# Patient Record
Sex: Male | Born: 1965 | Race: White | Hispanic: No | Marital: Married | Smoking: Never smoker
Health system: Southern US, Community
[De-identification: ages and names within clinical notes are randomized; demographics above are authoritative.]

## PROBLEM LIST (undated history)

## (undated) DIAGNOSIS — Z9861 Coronary angioplasty status: Secondary | ICD-10-CM

## (undated) DIAGNOSIS — F32A Depression, unspecified: Secondary | ICD-10-CM

## (undated) DIAGNOSIS — M1990 Unspecified osteoarthritis, unspecified site: Secondary | ICD-10-CM

## (undated) DIAGNOSIS — G4730 Sleep apnea, unspecified: Secondary | ICD-10-CM

## (undated) DIAGNOSIS — G43909 Migraine, unspecified, not intractable, without status migrainosus: Secondary | ICD-10-CM

## (undated) DIAGNOSIS — F329 Major depressive disorder, single episode, unspecified: Secondary | ICD-10-CM

## (undated) DIAGNOSIS — I10 Essential (primary) hypertension: Secondary | ICD-10-CM

## (undated) DIAGNOSIS — R51 Headache: Secondary | ICD-10-CM

## (undated) DIAGNOSIS — S81819A Laceration without foreign body, unspecified lower leg, initial encounter: Secondary | ICD-10-CM

## (undated) DIAGNOSIS — F419 Anxiety disorder, unspecified: Secondary | ICD-10-CM

## (undated) DIAGNOSIS — I200 Unstable angina: Secondary | ICD-10-CM

## (undated) DIAGNOSIS — I219 Acute myocardial infarction, unspecified: Secondary | ICD-10-CM

## (undated) DIAGNOSIS — R519 Headache, unspecified: Secondary | ICD-10-CM

## (undated) DIAGNOSIS — I2510 Atherosclerotic heart disease of native coronary artery without angina pectoris: Secondary | ICD-10-CM

## (undated) DIAGNOSIS — K219 Gastro-esophageal reflux disease without esophagitis: Secondary | ICD-10-CM

## (undated) HISTORY — PX: WISDOM TOOTH EXTRACTION: SHX21

## (undated) HISTORY — DX: Migraine, unspecified, not intractable, without status migrainosus: G43.909

## (undated) HISTORY — DX: Laceration without foreign body, unspecified lower leg, initial encounter: S81.819A

## (undated) HISTORY — DX: Depression, unspecified: F32.A

## (undated) HISTORY — DX: Anxiety disorder, unspecified: F41.9

## (undated) HISTORY — DX: Major depressive disorder, single episode, unspecified: F32.9

---

## 1996-01-04 HISTORY — PX: SHOULDER SURGERY: SHX246

## 1997-08-03 ENCOUNTER — Emergency Department (HOSPITAL_COMMUNITY): Admission: EM | Admit: 1997-08-03 | Discharge: 1997-08-03 | Payer: Self-pay | Admitting: Emergency Medicine

## 1997-12-12 ENCOUNTER — Encounter (HOSPITAL_COMMUNITY): Admission: RE | Admit: 1997-12-12 | Discharge: 1998-03-12 | Payer: Self-pay | Admitting: Emergency Medicine

## 1998-06-08 ENCOUNTER — Encounter: Payer: Self-pay | Admitting: Family Medicine

## 1998-06-08 ENCOUNTER — Ambulatory Visit (HOSPITAL_COMMUNITY): Admission: RE | Admit: 1998-06-08 | Discharge: 1998-06-08 | Payer: Self-pay | Admitting: Family Medicine

## 1999-06-29 ENCOUNTER — Emergency Department (HOSPITAL_COMMUNITY): Admission: EM | Admit: 1999-06-29 | Discharge: 1999-06-29 | Payer: Self-pay | Admitting: Emergency Medicine

## 1999-08-04 ENCOUNTER — Emergency Department (HOSPITAL_COMMUNITY): Admission: EM | Admit: 1999-08-04 | Discharge: 1999-08-04 | Payer: Self-pay

## 1999-08-05 ENCOUNTER — Emergency Department (HOSPITAL_COMMUNITY): Admission: EM | Admit: 1999-08-05 | Discharge: 1999-08-05 | Payer: Self-pay | Admitting: Internal Medicine

## 2000-07-04 ENCOUNTER — Emergency Department (HOSPITAL_COMMUNITY): Admission: EM | Admit: 2000-07-04 | Discharge: 2000-07-04 | Payer: Self-pay | Admitting: Emergency Medicine

## 2000-08-17 ENCOUNTER — Emergency Department (HOSPITAL_COMMUNITY): Admission: EM | Admit: 2000-08-17 | Discharge: 2000-08-17 | Payer: Self-pay | Admitting: *Deleted

## 2002-04-25 ENCOUNTER — Emergency Department (HOSPITAL_COMMUNITY): Admission: EM | Admit: 2002-04-25 | Discharge: 2002-04-25 | Payer: Self-pay | Admitting: Emergency Medicine

## 2002-06-20 ENCOUNTER — Emergency Department (HOSPITAL_COMMUNITY): Admission: EM | Admit: 2002-06-20 | Discharge: 2002-06-20 | Payer: Self-pay | Admitting: Emergency Medicine

## 2002-07-03 ENCOUNTER — Emergency Department (HOSPITAL_COMMUNITY): Admission: EM | Admit: 2002-07-03 | Discharge: 2002-07-03 | Payer: Self-pay | Admitting: Emergency Medicine

## 2004-10-05 ENCOUNTER — Ambulatory Visit: Payer: Self-pay | Admitting: Family Medicine

## 2004-11-16 ENCOUNTER — Ambulatory Visit: Payer: Self-pay | Admitting: Family Medicine

## 2005-02-15 ENCOUNTER — Ambulatory Visit: Payer: Self-pay | Admitting: Family Medicine

## 2005-02-17 ENCOUNTER — Ambulatory Visit: Payer: Self-pay | Admitting: Family Medicine

## 2005-09-13 ENCOUNTER — Ambulatory Visit: Payer: Self-pay | Admitting: Family Medicine

## 2005-09-20 ENCOUNTER — Ambulatory Visit: Payer: Self-pay | Admitting: Family Medicine

## 2005-11-02 ENCOUNTER — Ambulatory Visit: Payer: Self-pay | Admitting: Family Medicine

## 2006-01-06 ENCOUNTER — Ambulatory Visit: Payer: Self-pay | Admitting: Family Medicine

## 2006-01-17 ENCOUNTER — Ambulatory Visit: Payer: Self-pay | Admitting: Family Medicine

## 2006-01-17 LAB — CONVERTED CEMR LAB
ALT: 41 units/L — ABNORMAL HIGH (ref 0–40)
AST: 26 units/L (ref 0–37)
Cholesterol: 187 mg/dL (ref 0–200)
HDL: 34.1 mg/dL — ABNORMAL LOW (ref >39.0)
LDL Cholesterol: 113 mg/dL — ABNORMAL HIGH (ref 0–99)
Total CHOL/HDL Ratio: 5.5
Triglycerides: 197 mg/dL — ABNORMAL HIGH (ref 0–149)
VLDL: 39 mg/dL (ref 0–40)

## 2006-07-20 DIAGNOSIS — I10 Essential (primary) hypertension: Secondary | ICD-10-CM | POA: Insufficient documentation

## 2006-07-24 ENCOUNTER — Ambulatory Visit: Payer: Self-pay | Admitting: Family Medicine

## 2006-07-24 DIAGNOSIS — G4733 Obstructive sleep apnea (adult) (pediatric): Secondary | ICD-10-CM | POA: Insufficient documentation

## 2006-08-15 ENCOUNTER — Ambulatory Visit: Payer: Self-pay | Admitting: Pulmonary Disease

## 2006-08-15 ENCOUNTER — Encounter: Payer: Self-pay | Admitting: Family Medicine

## 2006-08-30 ENCOUNTER — Ambulatory Visit (HOSPITAL_BASED_OUTPATIENT_CLINIC_OR_DEPARTMENT_OTHER): Admission: RE | Admit: 2006-08-30 | Discharge: 2006-08-30 | Payer: Self-pay | Admitting: Pulmonary Disease

## 2006-09-13 ENCOUNTER — Ambulatory Visit: Payer: Self-pay | Admitting: Pulmonary Disease

## 2006-10-13 ENCOUNTER — Ambulatory Visit: Payer: Self-pay | Admitting: Pulmonary Disease

## 2006-11-15 ENCOUNTER — Encounter (INDEPENDENT_AMBULATORY_CARE_PROVIDER_SITE_OTHER): Payer: Self-pay | Admitting: *Deleted

## 2007-02-15 ENCOUNTER — Ambulatory Visit: Payer: Self-pay | Admitting: Family Medicine

## 2007-03-22 ENCOUNTER — Encounter (INDEPENDENT_AMBULATORY_CARE_PROVIDER_SITE_OTHER): Payer: Self-pay | Admitting: *Deleted

## 2007-03-22 ENCOUNTER — Ambulatory Visit: Payer: Self-pay | Admitting: Family Medicine

## 2007-05-01 ENCOUNTER — Ambulatory Visit: Payer: Self-pay | Admitting: Family Medicine

## 2007-05-01 LAB — CONVERTED CEMR LAB
ALT: 49 units/L (ref 0–53)
AST: 29 units/L (ref 0–37)
Albumin: 4.2 g/dL (ref 3.5–5.2)
Alkaline Phosphatase: 53 units/L (ref 39–117)
BUN: 14 mg/dL (ref 6–23)
Basophils Absolute: 0 10*3/uL (ref 0.0–0.1)
Basophils Relative: 0.8 % (ref 0.0–1.0)
Bilirubin, Direct: 0.1 mg/dL (ref 0.0–0.3)
CO2: 28 meq/L (ref 19–32)
Calcium: 9.2 mg/dL (ref 8.4–10.5)
Chloride: 104 meq/L (ref 96–112)
Cholesterol: 239 mg/dL (ref 0–200)
Creatinine, Ser: 1.1 mg/dL (ref 0.4–1.5)
Direct LDL: 166.1 mg/dL
Eosinophils Absolute: 0.1 10*3/uL (ref 0.0–0.7)
Eosinophils Relative: 2.3 % (ref 0.0–5.0)
GFR calc Af Amer: 95 mL/min
GFR calc non Af Amer: 78 mL/min
Glucose, Bld: 103 mg/dL — ABNORMAL HIGH (ref 70–99)
HCT: 41.7 % (ref 39.0–52.0)
HDL: 28.8 mg/dL — ABNORMAL LOW (ref >39.0)
Hemoglobin: 14.4 g/dL (ref 13.0–17.0)
Lymphocytes Relative: 34.1 % (ref 12.0–46.0)
MCHC: 34.5 g/dL (ref 30.0–36.0)
MCV: 85.9 fL (ref 78.0–100.0)
Monocytes Absolute: 0.3 10*3/uL (ref 0.1–1.0)
Monocytes Relative: 6.6 % (ref 3.0–12.0)
Neutro Abs: 2.9 10*3/uL (ref 1.4–7.7)
Neutrophils Relative %: 56.2 % (ref 43.0–77.0)
PSA: 0.33 ng/mL (ref 0.10–4.00)
Platelets: 202 10*3/uL (ref 150–400)
Potassium: 4.1 meq/L (ref 3.5–5.1)
RBC: 4.86 M/uL (ref 4.22–5.81)
RDW: 12.4 % (ref 11.5–14.6)
Sodium: 140 meq/L (ref 135–145)
TSH: 0.89 microintl units/mL (ref 0.35–5.50)
Total Bilirubin: 0.8 mg/dL (ref 0.3–1.2)
Total CHOL/HDL Ratio: 8.3
Total Protein: 7 g/dL (ref 6.0–8.3)
Triglycerides: 203 mg/dL (ref 0–149)
VLDL: 41 mg/dL — ABNORMAL HIGH (ref 0–40)
WBC: 5 10*3/uL (ref 4.5–10.5)

## 2007-05-04 ENCOUNTER — Ambulatory Visit: Payer: Self-pay | Admitting: Family Medicine

## 2007-05-04 DIAGNOSIS — R7309 Other abnormal glucose: Secondary | ICD-10-CM | POA: Insufficient documentation

## 2007-08-21 ENCOUNTER — Encounter (INDEPENDENT_AMBULATORY_CARE_PROVIDER_SITE_OTHER): Payer: Self-pay | Admitting: *Deleted

## 2007-10-27 ENCOUNTER — Encounter: Payer: Self-pay | Admitting: Pulmonary Disease

## 2007-11-06 ENCOUNTER — Ambulatory Visit: Payer: Self-pay | Admitting: Family Medicine

## 2007-11-07 ENCOUNTER — Encounter: Payer: Self-pay | Admitting: Family Medicine

## 2008-02-21 ENCOUNTER — Telehealth: Payer: Self-pay | Admitting: Family Medicine

## 2008-05-14 ENCOUNTER — Encounter (INDEPENDENT_AMBULATORY_CARE_PROVIDER_SITE_OTHER): Payer: Self-pay | Admitting: *Deleted

## 2008-05-23 ENCOUNTER — Ambulatory Visit: Payer: Self-pay | Admitting: Family Medicine

## 2008-05-24 LAB — CONVERTED CEMR LAB
ALT: 39 units/L (ref 0–53)
AST: 25 units/L (ref 0–37)
Albumin: 4.3 g/dL (ref 3.5–5.2)
Alkaline Phosphatase: 62 units/L (ref 39–117)
BUN: 12 mg/dL (ref 6–23)
Basophils Absolute: 0 10*3/uL (ref 0.0–0.1)
Basophils Relative: 0.2 % (ref 0.0–3.0)
Bilirubin, Direct: 0.1 mg/dL (ref 0.0–0.3)
CO2: 31 meq/L (ref 19–32)
Calcium: 9.4 mg/dL (ref 8.4–10.5)
Chloride: 105 meq/L (ref 96–112)
Cholesterol: 239 mg/dL — ABNORMAL HIGH (ref 0–200)
Creatinine, Ser: 1 mg/dL (ref 0.4–1.5)
Creatinine,U: 27.7 mg/dL
Direct LDL: 168 mg/dL
Eosinophils Absolute: 0.1 10*3/uL (ref 0.0–0.7)
Eosinophils Relative: 2.3 % (ref 0.0–5.0)
GFR calc non Af Amer: 86.77 mL/min (ref >60)
Glucose, Bld: 89 mg/dL (ref 70–99)
HCT: 42.8 % (ref 39.0–52.0)
HDL: 31.2 mg/dL — ABNORMAL LOW (ref >39.00)
Hemoglobin: 14.9 g/dL (ref 13.0–17.0)
Lymphocytes Relative: 27.5 % (ref 12.0–46.0)
Lymphs Abs: 1.6 10*3/uL (ref 0.7–4.0)
MCHC: 34.7 g/dL (ref 30.0–36.0)
MCV: 86.2 fL (ref 78.0–100.0)
Microalb Creat Ratio: 3.6 mg/g (ref 0.0–30.0)
Microalb, Ur: 0.1 mg/dL (ref 0.0–1.9)
Monocytes Absolute: 0.3 10*3/uL (ref 0.1–1.0)
Monocytes Relative: 5.8 % (ref 3.0–12.0)
Neutro Abs: 3.8 10*3/uL (ref 1.4–7.7)
Neutrophils Relative %: 64.2 % (ref 43.0–77.0)
PSA: 0.21 ng/mL (ref 0.10–4.00)
Platelets: 198 10*3/uL (ref 150.0–400.0)
Potassium: 3.9 meq/L (ref 3.5–5.1)
RBC: 4.97 M/uL (ref 4.22–5.81)
RDW: 12.6 % (ref 11.5–14.6)
Sodium: 139 meq/L (ref 135–145)
TSH: 0.75 microintl units/mL (ref 0.35–5.50)
Total Bilirubin: 1.1 mg/dL (ref 0.3–1.2)
Total CHOL/HDL Ratio: 8
Total Protein: 7.3 g/dL (ref 6.0–8.3)
Triglycerides: 221 mg/dL — ABNORMAL HIGH (ref 0.0–149.0)
VLDL: 44.2 mg/dL — ABNORMAL HIGH (ref 0.0–40.0)
WBC: 5.8 10*3/uL (ref 4.5–10.5)

## 2008-05-26 ENCOUNTER — Ambulatory Visit: Payer: Self-pay | Admitting: Family Medicine

## 2008-11-26 ENCOUNTER — Ambulatory Visit: Payer: Self-pay | Admitting: Family Medicine

## 2009-04-06 ENCOUNTER — Ambulatory Visit: Payer: Self-pay | Admitting: General Practice

## 2009-08-11 ENCOUNTER — Encounter (INDEPENDENT_AMBULATORY_CARE_PROVIDER_SITE_OTHER): Payer: Self-pay | Admitting: *Deleted

## 2010-02-02 NOTE — Letter (Signed)
Summary: Nadara Eaton letter  [place] at [facility]  [address] [place], [place] [address]   Phone: [phone]  Fax: [phone]       08/11/2009 MRN: [id number]  Martin Guzman [address] [place], [place]  [address]  Dear Mr. Sherr[facility] [place], and [facility] announce the retirement of Arta Silence, M.D., from full-time practice at the [facility] office effective July 02, 2009 and his plans of returning part-time.  It is important to Dr. Hetty Ely and to our practice that you understand that [facility] has seven physicians in our office for your health care needs.  We will continue to offer the same exceptional care that you have today.    Dr. Hetty Ely has spoken to many of you about his plans for retirement and returning part-time in the fall.   We will continue to work with you through the transition to schedule appointments for you in the office and meet the high standards that [facility] is committed to.   Again, it is with great pleasure that we share the news that Dr. Hetty Ely will return to [facility] at [facility] in October of 2011 with a reduced schedule.    If you have any questions, or would like to request an appointment with one of our physicians, please call us at [phone] and press the option for Scheduling an appointment.  We take pleasure in providing you with excellent patient care and look forward to seeing you at your next office visit.  Our [facility] Physicians are:  Martin Guzman, M.D. Martin Guzman, M.D. Martin Guzman, M.D. Martin Guzman, M.D. Martin Guzman, M.D. Martin Guzman, M.D. We proudly welcomed Martin Guzman, M.D. and Martin Guzman, M.D. to the practice in July/August 2011.  Sincerely,  [facility]

## 2010-05-18 NOTE — Assessment & Plan Note (Signed)
[facility]                             PULMONARY OFFICE NOTE   NAME:Lampley, AKHIL PISCOPO                      MRN:          [id number]  DATE:10/13/2006                            DOB:          August 28, 1965    SUBJECTIVE:  Mr. Thrush comes in today for followup after his recent  sleep study.  He was found to have severe obstructive sleep apnea with  an apnea/hypopnea index of 74 events per hour and O2 desaturation as low  as 63%.  I had a long discussion with the patient and have gone over his  study with him and answered all questions.   PHYSICAL EXAM:  GENERAL:  He is an overweight male in no acute distress.  Blood pressure is 120/76, pulse 87, temperature 98.2, weight 269 pounds,  desaturation on room air is 96%.   IMPRESSION:  Severe obstructive sleep apnea with significant O2  desaturation.  At this point in time, the patient really needs to work  aggressively on weight loss, and I would also highly recommend CPAP  therapy.  The patient is agreeable to this approach.  I have also  discussed with him other alternatives including upper airway surgery or  oral appliance, but I have explained to him these rarely result in cure  of this degree of sleep apnea.  The patient is agreeable to trying CPAP.   PLAN:  1. Initiate CPAP at 10 cm.  2. Work on weight loss.  3. Follow up in 4 weeks or sooner if there are problems.  We will try      to optimize his pressure at that time.     Barbaraann Share, MD,FCCP  Electronically Signed    [facility]  DD: 10/13/2006  DT: 10/13/2006  Job #: [id number]   cc:   Arta Silence, MD

## 2010-05-18 NOTE — Assessment & Plan Note (Signed)
HEALTHCARE                             PULMONARY OFFICE NOTE   NAME:Martin Guzman                      MRN:          [id number]  DATE:08/15/2006                            DOB:          Aug 14, 1965    HISTORY OF PRESENT ILLNESS:  The patient is a 45 year old gentleman who  I have been asked to see for possible obstructive sleep apnea.  The  patient has been noted to have loud snoring as well as pauses in his  breathing during sleep.  He has also had occasional choking episodes.  The patient typically goes to bed between 10 and 12 and gets up at 6  a.m. to start his day.  He is tired in the morning when he arises.  The  patient works as a Retail banker and does note significant sleep  pressure with periods of inactivity.  He also has difficulty with dozing  during TV and movies and also driving long distances.  The patient will  take Vivarin on occasions.  He has no difficulty with short distances.  Of note, his weight has increased about 78 pounds over the last two  years.   PAST MEDICAL HISTORY:  1. Hypertension.  2. Dyslipidemia.  3. History of chronic headaches.  4. History of allergic rhinitis.   CURRENT MEDICATIONS:  1. Hydrochlorothiazide 25 mg daily.  2. Lexapro 20 mg daily.  3. Pravastatin 20 mg daily.   ALLERGIES:  No known drug allergies.   SOCIAL HISTORY:  He is married and has children.  He has never smoked.   FAMILY HISTORY:  Remarkable for a brother who has had a heart attack,  otherwise noncontributory.   REVIEW OF SYSTEMS:  As per history of present illness.  Also see patient  intake form documented in the chart.   PHYSICAL EXAMINATION:  GENERAL:  He is an obese male in no acute  distress.  VITAL SIGNS:  Blood pressure 138/92, pulse 79, temperature 98.1, weight  is 278 pounds.  O2 saturation on room air is 96%.  HEENT:  Pupils equal, round, and reactive to light and accommodation.  Extraocular muscles are intact.   Nares are patent without discharge, but  does show deviation to the right.  Oropharynx does show severe  elongation of the soft palate with a normal uvula.  NECK:  Supple without JVD or lymphadenopathy.  No palpable thyromegaly.  CHEST:  Totally clear.  HEART:  Regular rate and rhythm with no murmurs, rubs, or gallops.  ABDOMEN:  Soft and nontender with good bowel sounds.  GENITOURINARY:  RECTAL:  BREASTS:  Not done and not indicated.  EXTREMITIES:  Lower extremities are without edema.  Pulses are intact  distally.  NEUROLOGY:  Alert and oriented with no obvious motor deficits.   IMPRESSION:  Probable obstructive sleep apnea.  The patient gives a very  good history for this.  She has abnormal upper airway anatomy, and is  morbidly obese.  I had a long discussion with him about sleep apnea and  the importance with respect to quality of life issues and cardiovascular  issues.  The patient is agreeable to proceeding with a sleep study.   PLAN:  1. Schedule for nocturnal polysomnogram.  2. Work on weight loss.  3. The patient will follow up after the above.     Barbaraann Share, MD,FCCP  Electronically Signed    [facility]  DD: 10/10/2006  DT: 10/11/2006  Job #: [id number]   cc:   Arta Silence, MD

## 2010-05-18 NOTE — Procedures (Signed)
NAME:  Martin Guzman, Martin Guzman NO.:  [id number]   MEDICAL RECORD NO.:  [id number]          PATIENT TYPE:  OUT   LOCATION:  SLEEP CENTER                 [facility]ACILITY[facility]  [facility]   PHYSICIAN:  Barbaraann Share, MD,FCCPDATE OF BIRTH:  Jun 05, 1965   DATE OF STUDY:  08/30/2006                            NOCTURNAL POLYSOMNOGRAM   REFERRING PHYSICIAN:  Barbaraann Share, MD,FCCP   LOCATION:  [facility].   INDICATIONS FOR THE STUDY:  Hypersomnia with sleep apnea.   EPWORTH SCORE:  Seven.   SLEEP ARCHITECTURE:  The patient had total sleep time of 425 minutes  with very little slow wave sleep and decreased REM.  Sleep onset latency  was normal and REM onset was very prolonged at 236 minutes.  Sleep  efficiency was decreased at 89%.   RESPIRATORY DATA:  The patient was found to have 203 hypopneas and 318  obstructive apneas for an apnea-hypopnea index of 74 events per hour.  The events occurred in all body positions and there was very loud  snoring noted throughout.   OXYGEN DATA:  There was O2 saturation as low as 63% with patient's  obstructive events.   CARDIAC DATA:  No clinically significant cardiac arrhythmias were noted.   MOVEMENTS/PARASOMNIA:  None.   IMPRESSION/RECOMMENDATIONS:  Severe obstructive sleep apnea/hypopnea  syndrome with apnea-hypopnea index of 74 events per hour and oxygen  desaturation as low as 63%.  Treatment for this degree of sleep apnea  should focus primarily on continuous positive airway pressure as well as  weight loss.      Barbaraann Share, MD,FCCP  Diplomate, American Board of Sleep  Medicine  Electronically Signed     [facility]  D:  09/13/2006 14:02:22  T:  09/14/2006 10:07:52  Job:  [id number]

## 2012-10-29 DIAGNOSIS — M129 Arthropathy, unspecified: Secondary | ICD-10-CM | POA: Insufficient documentation

## 2012-10-29 DIAGNOSIS — R12 Heartburn: Secondary | ICD-10-CM | POA: Insufficient documentation

## 2012-10-29 DIAGNOSIS — K219 Gastro-esophageal reflux disease without esophagitis: Secondary | ICD-10-CM | POA: Insufficient documentation

## 2012-10-29 DIAGNOSIS — M159 Polyosteoarthritis, unspecified: Secondary | ICD-10-CM | POA: Insufficient documentation

## 2012-10-29 DIAGNOSIS — G43909 Migraine, unspecified, not intractable, without status migrainosus: Secondary | ICD-10-CM | POA: Insufficient documentation

## 2012-12-11 DIAGNOSIS — R030 Elevated blood-pressure reading, without diagnosis of hypertension: Secondary | ICD-10-CM | POA: Insufficient documentation

## 2012-12-11 DIAGNOSIS — E782 Mixed hyperlipidemia: Secondary | ICD-10-CM | POA: Insufficient documentation

## 2012-12-11 DIAGNOSIS — E785 Hyperlipidemia, unspecified: Secondary | ICD-10-CM | POA: Insufficient documentation

## 2012-12-11 DIAGNOSIS — E291 Testicular hypofunction: Secondary | ICD-10-CM | POA: Insufficient documentation

## 2013-06-20 DIAGNOSIS — M549 Dorsalgia, unspecified: Secondary | ICD-10-CM | POA: Insufficient documentation

## 2013-12-03 DIAGNOSIS — R609 Edema, unspecified: Secondary | ICD-10-CM | POA: Insufficient documentation

## 2014-04-22 DIAGNOSIS — F330 Major depressive disorder, recurrent, mild: Secondary | ICD-10-CM | POA: Insufficient documentation

## 2014-04-22 DIAGNOSIS — F411 Generalized anxiety disorder: Secondary | ICD-10-CM | POA: Insufficient documentation

## 2015-05-04 DIAGNOSIS — F330 Major depressive disorder, recurrent, mild: Secondary | ICD-10-CM | POA: Diagnosis not present

## 2015-05-04 DIAGNOSIS — I119 Hypertensive heart disease without heart failure: Secondary | ICD-10-CM | POA: Diagnosis not present

## 2015-06-04 DIAGNOSIS — Z23 Encounter for immunization: Secondary | ICD-10-CM | POA: Diagnosis not present

## 2015-06-04 DIAGNOSIS — I119 Hypertensive heart disease without heart failure: Secondary | ICD-10-CM | POA: Diagnosis not present

## 2015-06-05 DIAGNOSIS — H5213 Myopia, bilateral: Secondary | ICD-10-CM | POA: Diagnosis not present

## 2015-07-14 DIAGNOSIS — I119 Hypertensive heart disease without heart failure: Secondary | ICD-10-CM | POA: Diagnosis not present

## 2015-09-14 DIAGNOSIS — I119 Hypertensive heart disease without heart failure: Secondary | ICD-10-CM | POA: Diagnosis not present

## 2015-09-14 DIAGNOSIS — Z23 Encounter for immunization: Secondary | ICD-10-CM | POA: Diagnosis not present

## 2015-11-16 DIAGNOSIS — I119 Hypertensive heart disease without heart failure: Secondary | ICD-10-CM | POA: Diagnosis not present

## 2015-11-30 ENCOUNTER — Other Ambulatory Visit (HOSPITAL_COMMUNITY): Payer: Self-pay | Admitting: Internal Medicine

## 2015-11-30 DIAGNOSIS — R079 Chest pain, unspecified: Secondary | ICD-10-CM

## 2015-11-30 DIAGNOSIS — R0789 Other chest pain: Secondary | ICD-10-CM | POA: Diagnosis not present

## 2015-12-01 ENCOUNTER — Ambulatory Visit (HOSPITAL_COMMUNITY)
Admission: RE | Admit: 2015-12-01 | Discharge: 2015-12-01 | Disposition: A | Discharge status: Home / Self Care | Payer: Self-pay | Source: Ambulatory Visit | Attending: Internal Medicine | Admitting: Internal Medicine

## 2015-12-01 DIAGNOSIS — R079 Chest pain, unspecified: Secondary | ICD-10-CM

## 2015-12-01 DIAGNOSIS — R0789 Other chest pain: Secondary | ICD-10-CM | POA: Diagnosis not present

## 2015-12-07 ENCOUNTER — Other Ambulatory Visit: Payer: Self-pay

## 2015-12-07 ENCOUNTER — Telehealth: Payer: Self-pay | Admitting: Cardiovascular Disease

## 2015-12-07 NOTE — Telephone Encounter (Signed)
Received records from Dr Levin Erp for appointment on 12/08/15 with Dr Gwenlyn Found.  Records given to [facility] (medical records) for Dr Kennon Holter schedule on 12/08/15. lp

## 2015-12-08 ENCOUNTER — Ambulatory Visit (INDEPENDENT_AMBULATORY_CARE_PROVIDER_SITE_OTHER): Payer: BLUE CROSS/BLUE SHIELD | Admitting: Cardiovascular Disease

## 2015-12-08 ENCOUNTER — Other Ambulatory Visit: Payer: Self-pay | Admitting: Cardiovascular Disease

## 2015-12-08 ENCOUNTER — Ambulatory Visit
Admission: RE | Admit: 2015-12-08 | Discharge: 2015-12-08 | Disposition: A | Discharge status: Home / Self Care | Payer: BLUE CROSS/BLUE SHIELD | Source: Ambulatory Visit | Attending: Cardiovascular Disease | Admitting: Cardiovascular Disease

## 2015-12-08 ENCOUNTER — Encounter: Payer: Self-pay | Admitting: Cardiovascular Disease

## 2015-12-08 VITALS — BP 96/70 | HR 62 | Ht 72.0 in | Wt 308.0 lb

## 2015-12-08 DIAGNOSIS — I10 Essential (primary) hypertension: Secondary | ICD-10-CM | POA: Diagnosis not present

## 2015-12-08 DIAGNOSIS — R079 Chest pain, unspecified: Secondary | ICD-10-CM

## 2015-12-08 DIAGNOSIS — Z01810 Encounter for preprocedural cardiovascular examination: Secondary | ICD-10-CM

## 2015-12-08 DIAGNOSIS — R0789 Other chest pain: Secondary | ICD-10-CM | POA: Diagnosis not present

## 2015-12-08 DIAGNOSIS — I208 Other forms of angina pectoris: Secondary | ICD-10-CM

## 2015-12-08 LAB — CBC WITH DIFFERENTIAL/PLATELET
BASOS PCT: 0 %
Basophils Absolute: 0 cells/uL (ref 0–200)
Eosinophils Absolute: 132 cells/uL (ref 15–500)
Eosinophils Relative: 2 %
HCT: 49.8 % (ref 38.5–50.0)
Hemoglobin: 17.1 g/dL (ref 13.2–17.1)
LYMPHS PCT: 38 %
Lymphs Abs: 2508 cells/uL (ref 850–3900)
MCH: 30 pg (ref 27.0–33.0)
MCHC: 34.3 g/dL (ref 32.0–36.0)
MCV: 87.4 fL (ref 80.0–100.0)
MONOS PCT: 6 %
MPV: 11.2 fL (ref 7.5–12.5)
Monocytes Absolute: 396 cells/uL (ref 200–950)
Neutro Abs: 3564 cells/uL (ref 1500–7800)
Neutrophils Relative %: 54 %
PLATELETS: 203 10*3/uL (ref 140–400)
RBC: 5.7 MIL/uL (ref 4.20–5.80)
RDW: 14.3 % (ref 11.0–15.0)
WBC: 6.6 10*3/uL (ref 3.8–10.8)

## 2015-12-08 LAB — BASIC METABOLIC PANEL WITH GFR
BUN: 13 mg/dL (ref 7–25)
CALCIUM: 9.3 mg/dL (ref 8.6–10.3)
CHLORIDE: 102 mmol/L (ref 98–110)
CO2: 23 mmol/L (ref 20–31)
CREATININE: 1.27 mg/dL (ref 0.70–1.33)
GFR, EST NON AFRICAN AMERICAN: 65 mL/min (ref >=60)
GFR, Est African American: 76 mL/min (ref >=60)
Glucose, Bld: 126 mg/dL — ABNORMAL HIGH (ref 65–99)
Potassium: 4.1 mmol/L (ref 3.5–5.3)
SODIUM: 138 mmol/L (ref 135–146)

## 2015-12-08 LAB — TSH: TSH: 1 m[IU]/L (ref 0.40–4.50)

## 2015-12-08 NOTE — Patient Instructions (Signed)
Your physician has requested that you have a cardiac catheterization. Cardiac catheterization is used to diagnose and/or treat various heart conditions. Doctors may recommend this procedure for a number of different reasons. The most common reason is to evaluate chest pain. Chest pain can be a symptom of coronary artery disease (CAD), and cardiac catheterization can show whether plaque is narrowing or blocking your heart's arteries. This procedure is also used to evaluate the valves, as well as measure the blood flow and oxygen levels in different parts of your heart. For further information please visit [email or web address].   Following your catheterization, you will not be allowed to drive for 3 days.  No lifting, pushing, or pulling greater that 10 pounds is allowed for 1 week.  You will be required to have the following tests prior to the procedure:  1. Blood work-the blood work can be done no more than 14 days prior to the procedure.  It can be done at any [facility] lab.  There is one downstairs on the first floor of this building and one in the [facility] building [phone]).  2. Chest Xray-the chest xray order has already been placed at the [place].      Puncture site RADIAL

## 2015-12-08 NOTE — Assessment & Plan Note (Signed)
History of hypertension with blood pressure measured today at 96/70. He is on hydrochlorothiazide, lisinopril and metoprolol. Continue current meds at current dosing

## 2015-12-08 NOTE — Progress Notes (Signed)
12/08/2015 Roemello Langbehn Sperling   Dec 26, 1965  [id number]  Primary Physician GREEN, Keenan Bachelor, MD Primary Cardiologist: [name] MD [name]  HPI:  Mr. Cotterill is a 50 year old mildly overweight married Caucasian male father of one 61 year old daughter accompanied by his wife Cindi today. He was referred by [name] as well as his PCP, [name] for cardiovascular evaluation because of new-onset chest pain. His risk factors include treated hypertension and hyperlipidemia untreated according to the chart. He does have obstructive sleep apnea on sleep. He does not smoke nor is he diabetic. He has a strong family history for heart disease with a mother who has apparently 76 stents by his account and a brother who had stenting at age 40. New-onset chest pain approximately 4 weeks ago has had 6 episodes since. Currently during times of stress and exercise with left upper extremity radiation. He did have a routine GXT at the hospital by [name] on 11/2015 which was nonischemic although he developed substernal chest pain relieved with sublingual nitroglycerin.   Current Outpatient Prescriptions  Medication Sig Dispense Refill  . ALPRAZolam (NIRAVAM) 0.25 MG dissolvable tablet Take 0.25 mg by mouth as needed.    . ARIPiprazole (ABILIFY) 2 MG tablet Take 2 mg by mouth daily.    Marland Kitchen aspirin 81 MG tablet Take 81 mg by mouth daily.    . Fluvoxamine Maleate 150 MG CP24 Take 150 mg by mouth daily.    . hydrochlorothiazide (HYDRODIURIL) 25 MG tablet Take 25 mg by mouth daily.    Marland Kitchen lisinopril (PRINIVIL,ZESTRIL) 40 MG tablet Take 40 mg by mouth daily.    . metoprolol succinate (TOPROL-XL) 50 MG 24 hr tablet Take 50 mg by mouth daily. Take with or immediately following a meal.    . testosterone cypionate (DEPOTESTOSTERONE CYPIONATE) 200 MG/ML injection Inject 200 mg into the muscle every 14 (fourteen) days.     No current facility-administered medications for this visit.     No  Known Allergies  Social History   Social History  . Marital status: Married    Spouse name: N/A  . Number of children: N/A  . Years of education: N/A   Occupational History  . Not on file.   Social History Main Topics  . Smoking status: Never Smoker  . Smokeless tobacco: Never Used  . Alcohol use Not on file  . Drug use: Unknown  . Sexual activity: Not on file   Other Topics Concern  . Not on file   Social History Narrative  . No narrative on file     Review of Systems: General: negative for chills, fever, night sweats or weight changes.  Cardiovascular: negative for chest pain, dyspnea on exertion, edema, orthopnea, palpitations, paroxysmal nocturnal dyspnea or shortness of breath Dermatological: negative for rash Respiratory: negative for cough or wheezing Urologic: negative for hematuria Abdominal: negative for nausea, vomiting, diarrhea, bright red blood per rectum, melena, or hematemesis Neurologic: negative for visual changes, syncope, or dizziness All other systems reviewed and are otherwise negative except as noted above.    Blood pressure 96/70, pulse 62, height 6' (1.829 m), weight (!) 308 lb (139.7 kg).  General appearance: alert and no distress Neck: no adenopathy, no carotid bruit, no JVD, supple, symmetrical, trachea midline and thyroid not enlarged, symmetric, no tenderness/mass/nodules Lungs: clear to auscultation bilaterally Heart: regular rate and rhythm, S1, S2 normal, no murmur, click, rub or gallop Extremities: extremities normal, atraumatic, no cyanosis or edema  EKG sinus rhythm at 62 poor R-wave progression. I personally reviewed this EKG  ASSESSMENT AND PLAN:   Essential hypertension History of hypertension with blood pressure measured today at 96/70. He is on hydrochlorothiazide, lisinopril and metoprolol. Continue current meds at current dosing  Hyperlipidemia History of hyperlipidemia not on statin therapy followed by his PCP  Chest  pain Mr. Wulf was referred for evaluation of new onset chest pain. He is a 77-year-old mildly overweight Caucasian male with a strong family history of heart disease and history of hypertension. Currently his mother has had "72 stents" and a brother had a stent at age 41. He noticed new-onset chest pain approximately one month ago and has had problems with 6 episodes. These occur during times of stress and exertion with left upper extremity radiation. Recent routine GXT performed by [name] on 11/23/15 was negative for ischemia although the patient did have chest pain relieved with supplemental nitroglycerin. Based on his symptoms and risk factor profile I decided to proceed directly to the outpatient clinic catheterization via right radial approach. I have reviewed the risks, indications, and alternatives to cardiac catheterization, possible angioplasty, and stenting with the patient. Risks include but are not limited to bleeding, infection, vascular injury, stroke, myocardial infection, arrhythmia, kidney injury, radiation-related injury in the case of prolonged fluoroscopy use, emergency cardiac surgery, and death. The patient understands the risks of serious complication is 1-2 in 123XX123 with diagnostic cardiac cath and 1-2% or less with angioplasty/stenting.       [name] MD FACP,FACC,FAHA, [facility] 12/08/2015 10:35 AM

## 2015-12-08 NOTE — Assessment & Plan Note (Signed)
History of hyperlipidemia not on statin therapy followed by his PCP 

## 2015-12-08 NOTE — Assessment & Plan Note (Signed)
Martin Guzman was referred for evaluation of new onset chest pain. He is a 50-year-old mildly overweight Caucasian male with a strong family history of heart disease and history of hypertension. Currently his mother has had "77 stents" and a brother had a stent at age 92. He noticed new-onset chest pain approximately one month ago and has had problems with 6 episodes. These occur during times of stress and exertion with left upper extremity radiation. Recent routine GXT performed by [name] on 11/23/15 was negative for ischemia although the patient did have chest pain relieved with supplemental nitroglycerin. Based on his symptoms and risk factor profile I decided to proceed directly to the outpatient clinic catheterization via right radial approach. I have reviewed the risks, indications, and alternatives to cardiac catheterization, possible angioplasty, and stenting with the patient. Risks include but are not limited to bleeding, infection, vascular injury, stroke, myocardial infection, arrhythmia, kidney injury, radiation-related injury in the case of prolonged fluoroscopy use, emergency cardiac surgery, and death. The patient understands the risks of serious complication is 1-2 in 123XX123 with diagnostic cardiac cath and 1-2% or less with angioplasty/stenting.

## 2015-12-09 LAB — PROTIME-INR
INR: 1
Prothrombin Time: 10.4 s (ref 9.0–11.5)

## 2015-12-09 LAB — APTT: APTT: 27 s (ref 22–34)

## 2015-12-14 ENCOUNTER — Encounter (HOSPITAL_COMMUNITY): Payer: Self-pay | Admitting: Cardiovascular Disease

## 2015-12-14 ENCOUNTER — Ambulatory Visit (HOSPITAL_COMMUNITY)
Admission: RE | Admit: 2015-12-14 | Discharge: 2015-12-15 | Disposition: A | Discharge status: Home / Self Care | Payer: BLUE CROSS/BLUE SHIELD | Source: Ambulatory Visit | Attending: Cardiovascular Disease | Admitting: Cardiovascular Disease

## 2015-12-14 ENCOUNTER — Encounter (HOSPITAL_COMMUNITY)
Admission: RE | Disposition: A | Discharge status: Home / Self Care | Payer: Self-pay | Source: Ambulatory Visit | Attending: Cardiovascular Disease

## 2015-12-14 DIAGNOSIS — I200 Unstable angina: Secondary | ICD-10-CM

## 2015-12-14 DIAGNOSIS — I959 Hypotension, unspecified: Secondary | ICD-10-CM | POA: Diagnosis not present

## 2015-12-14 DIAGNOSIS — E782 Mixed hyperlipidemia: Secondary | ICD-10-CM | POA: Diagnosis present

## 2015-12-14 DIAGNOSIS — R079 Chest pain, unspecified: Secondary | ICD-10-CM

## 2015-12-14 DIAGNOSIS — I25118 Atherosclerotic heart disease of native coronary artery with other forms of angina pectoris: Secondary | ICD-10-CM

## 2015-12-14 DIAGNOSIS — I2510 Atherosclerotic heart disease of native coronary artery without angina pectoris: Secondary | ICD-10-CM

## 2015-12-14 DIAGNOSIS — I10 Essential (primary) hypertension: Secondary | ICD-10-CM | POA: Diagnosis present

## 2015-12-14 DIAGNOSIS — I25119 Atherosclerotic heart disease of native coronary artery with unspecified angina pectoris: Secondary | ICD-10-CM | POA: Diagnosis not present

## 2015-12-14 DIAGNOSIS — Z8249 Family history of ischemic heart disease and other diseases of the circulatory system: Secondary | ICD-10-CM | POA: Diagnosis not present

## 2015-12-14 DIAGNOSIS — E785 Hyperlipidemia, unspecified: Secondary | ICD-10-CM | POA: Diagnosis present

## 2015-12-14 DIAGNOSIS — I209 Angina pectoris, unspecified: Secondary | ICD-10-CM | POA: Insufficient documentation

## 2015-12-14 DIAGNOSIS — Z7982 Long term (current) use of aspirin: Secondary | ICD-10-CM | POA: Diagnosis not present

## 2015-12-14 DIAGNOSIS — Z9861 Coronary angioplasty status: Secondary | ICD-10-CM

## 2015-12-14 HISTORY — PX: CORONARY STENT PLACEMENT: SHX1402

## 2015-12-14 HISTORY — DX: Atherosclerotic heart disease of native coronary artery without angina pectoris: I25.10

## 2015-12-14 HISTORY — DX: Headache, unspecified: R51.9

## 2015-12-14 HISTORY — PX: CARDIAC CATHETERIZATION: SHX172

## 2015-12-14 HISTORY — DX: Headache: R51

## 2015-12-14 HISTORY — DX: Major depressive disorder, single episode, unspecified: F32.9

## 2015-12-14 HISTORY — DX: Unspecified osteoarthritis, unspecified site: M19.90

## 2015-12-14 HISTORY — DX: Gastro-esophageal reflux disease without esophagitis: K21.9

## 2015-12-14 HISTORY — DX: Unstable angina: I20.0

## 2015-12-14 HISTORY — DX: Coronary angioplasty status: Z98.61

## 2015-12-14 HISTORY — DX: Depression, unspecified: F32.A

## 2015-12-14 HISTORY — DX: Anxiety disorder, unspecified: F41.9

## 2015-12-14 HISTORY — DX: Essential (primary) hypertension: I10

## 2015-12-14 LAB — POCT ACTIVATED CLOTTING TIME: Activated Clotting Time: 538 seconds

## 2015-12-14 SURGERY — LEFT HEART CATH AND CORONARY ANGIOGRAPHY

## 2015-12-14 MED ORDER — ATORVASTATIN CALCIUM 80 MG PO TABS
80.0000 mg | ORAL_TABLET | Freq: Every day | ORAL | Status: DC
  Administered 2015-12-14: 21:00:00 80 mg via ORAL
  Filled 2015-12-14: qty 1

## 2015-12-14 MED ORDER — ASPIRIN 81 MG PO CHEW
CHEWABLE_TABLET | ORAL | Status: AC
  Filled 2015-12-14: qty 1

## 2015-12-14 MED ORDER — LISINOPRIL 40 MG PO TABS: 40.0000 mg | ORAL_TABLET | Freq: Every day | ORAL | Status: DC

## 2015-12-14 MED ORDER — HEPARIN SODIUM (PORCINE) 1000 UNIT/ML IJ SOLN
INTRAMUSCULAR | Status: AC
  Filled 2015-12-14: qty 1

## 2015-12-14 MED ORDER — BIVALIRUDIN BOLUS VIA INFUSION - CUPID
INTRAVENOUS | Status: DC | PRN
  Administered 2015-12-14: 104.775 mg via INTRAVENOUS

## 2015-12-14 MED ORDER — HYDRALAZINE HCL 20 MG/ML IJ SOLN: 5.0000 mg | INTRAMUSCULAR | Status: DC | PRN

## 2015-12-14 MED ORDER — SODIUM CHLORIDE 0.9 % IV SOLN
INTRAVENOUS | Status: DC | PRN
  Administered 2015-12-14 (×2): 1.75 mg/kg/h via INTRAVENOUS

## 2015-12-14 MED ORDER — LIDOCAINE HCL (PF) 1 % IJ SOLN
INTRAMUSCULAR | Status: DC | PRN
  Administered 2015-12-14: 4 mL

## 2015-12-14 MED ORDER — ZOLPIDEM TARTRATE 5 MG PO TABS
10.0000 mg | ORAL_TABLET | Freq: Every evening | ORAL | Status: DC | PRN
  Administered 2015-12-14: 10 mg via ORAL
  Filled 2015-12-14: qty 2

## 2015-12-14 MED ORDER — SODIUM CHLORIDE 0.9 % WEIGHT BASED INFUSION: 1.0000 mL/kg/h | INTRAVENOUS | Status: DC

## 2015-12-14 MED ORDER — SODIUM CHLORIDE 0.9% FLUSH: 3.0000 mL | INTRAVENOUS | Status: DC | PRN

## 2015-12-14 MED ORDER — SODIUM CHLORIDE 0.9 % IV SOLN: INTRAVENOUS | Status: DC

## 2015-12-14 MED ORDER — CLOPIDOGREL BISULFATE 300 MG PO TABS
ORAL_TABLET | ORAL | Status: AC
  Filled 2015-12-14: qty 2

## 2015-12-14 MED ORDER — SODIUM CHLORIDE 0.9 % WEIGHT BASED INFUSION
3.0000 mL/kg/h | INTRAVENOUS | Status: DC
  Administered 2015-12-14: 3 mL/kg/h via INTRAVENOUS

## 2015-12-14 MED ORDER — CLOPIDOGREL BISULFATE 300 MG PO TABS
ORAL_TABLET | ORAL | Status: DC | PRN
  Administered 2015-12-14: 600 mg via ORAL

## 2015-12-14 MED ORDER — HYDROCHLOROTHIAZIDE 25 MG PO TABS: 25.0000 mg | ORAL_TABLET | Freq: Every day | ORAL | Status: DC

## 2015-12-14 MED ORDER — BIVALIRUDIN 250 MG IV SOLR
INTRAVENOUS | Status: AC
  Filled 2015-12-14: qty 250

## 2015-12-14 MED ORDER — VERAPAMIL HCL 2.5 MG/ML IV SOLN
INTRAVENOUS | Status: AC
  Filled 2015-12-14: qty 2

## 2015-12-14 MED ORDER — IOPAMIDOL (ISOVUE-370) INJECTION 76%
INTRAVENOUS | Status: AC
  Filled 2015-12-14: qty 100

## 2015-12-14 MED ORDER — ASPIRIN 81 MG PO CHEW
81.0000 mg | CHEWABLE_TABLET | Freq: Every day | ORAL | Status: DC
  Administered 2015-12-15: 10:00:00 81 mg via ORAL
  Filled 2015-12-14: qty 1

## 2015-12-14 MED ORDER — FAMOTIDINE IN NACL 20-0.9 MG/50ML-% IV SOLN
INTRAVENOUS | Status: AC
  Filled 2015-12-14: qty 50

## 2015-12-14 MED ORDER — HEPARIN (PORCINE) IN NACL 2-0.9 UNIT/ML-% IJ SOLN
INTRAMUSCULAR | Status: DC | PRN
  Administered 2015-12-14: 1000 mL

## 2015-12-14 MED ORDER — ANGIOPLASTY BOOK
Freq: Once | Status: AC
  Administered 2015-12-14: 21:00:00
  Filled 2015-12-14: qty 1

## 2015-12-14 MED ORDER — METOPROLOL SUCCINATE ER 50 MG PO TB24: 50.0000 mg | ORAL_TABLET | Freq: Every day | ORAL | Status: DC

## 2015-12-14 MED ORDER — VERAPAMIL HCL 2.5 MG/ML IV SOLN
INTRA_ARTERIAL | Status: DC | PRN
  Administered 2015-12-14: 15 mL via INTRA_ARTERIAL

## 2015-12-14 MED ORDER — METOPROLOL SUCCINATE ER 50 MG PO TB24
50.0000 mg | ORAL_TABLET | Freq: Every day | ORAL | Status: DC
  Administered 2015-12-14: 21:00:00 50 mg via ORAL
  Filled 2015-12-14: qty 1

## 2015-12-14 MED ORDER — HEPARIN (PORCINE) IN NACL 2-0.9 UNIT/ML-% IJ SOLN
INTRAMUSCULAR | Status: AC
  Filled 2015-12-14: qty 1000

## 2015-12-14 MED ORDER — HEPARIN SODIUM (PORCINE) 1000 UNIT/ML IJ SOLN
INTRAMUSCULAR | Status: DC | PRN
  Administered 2015-12-14: 7000 [IU] via INTRAVENOUS

## 2015-12-14 MED ORDER — ATORVASTATIN CALCIUM 80 MG PO TABS: 80.0000 mg | ORAL_TABLET | Freq: Every day | ORAL | Status: DC

## 2015-12-14 MED ORDER — METHYLPREDNISOLONE SODIUM SUCC 125 MG IJ SOLR
INTRAMUSCULAR | Status: AC
Start: 2015-12-14 — End: 2015-12-14
  Filled 2015-12-14: qty 2

## 2015-12-14 MED ORDER — LIDOCAINE HCL (PF) 1 % IJ SOLN
INTRAMUSCULAR | Status: AC
Start: 2015-12-14 — End: 2015-12-14
  Filled 2015-12-14: qty 30

## 2015-12-14 MED ORDER — SODIUM CHLORIDE 0.9% FLUSH
3.0000 mL | Freq: Two times a day (BID) | INTRAVENOUS | Status: DC
  Administered 2015-12-14: 3 mL via INTRAVENOUS

## 2015-12-14 MED ORDER — ACETAMINOPHEN 325 MG PO TABS: 650.0000 mg | ORAL_TABLET | ORAL | Status: DC | PRN

## 2015-12-14 MED ORDER — METHYLPREDNISOLONE SODIUM SUCC 125 MG IJ SOLR
INTRAMUSCULAR | Status: DC | PRN
  Administered 2015-12-14: 125 mg via INTRAVENOUS

## 2015-12-14 MED ORDER — ONDANSETRON HCL 4 MG/2ML IJ SOLN: 4.0000 mg | Freq: Four times a day (QID) | INTRAMUSCULAR | Status: DC | PRN

## 2015-12-14 MED ORDER — LABETALOL HCL 5 MG/ML IV SOLN: 10.0000 mg | INTRAVENOUS | Status: DC | PRN

## 2015-12-14 MED ORDER — SODIUM CHLORIDE 0.9 % IV SOLN
1.7500 mg/kg/h | INTRAVENOUS | Status: DC
  Administered 2015-12-14 (×3): 1.75 mg/kg/h via INTRAVENOUS
  Filled 2015-12-14 (×3): qty 250

## 2015-12-14 MED ORDER — NITROGLYCERIN 1 MG/10 ML FOR IR/CATH LAB
INTRA_ARTERIAL | Status: AC
  Filled 2015-12-14: qty 10

## 2015-12-14 MED ORDER — DIPHENHYDRAMINE HCL 50 MG/ML IJ SOLN
INTRAMUSCULAR | Status: DC | PRN
  Administered 2015-12-14: 25 mg via INTRAVENOUS

## 2015-12-14 MED ORDER — MORPHINE SULFATE (PF) 2 MG/ML IV SOLN: 2.0000 mg | INTRAVENOUS | Status: DC | PRN

## 2015-12-14 MED ORDER — ALPRAZOLAM 0.25 MG PO TABS: 0.2500 mg | ORAL_TABLET | Freq: Every evening | ORAL | Status: DC | PRN

## 2015-12-14 MED ORDER — CLOPIDOGREL BISULFATE 75 MG PO TABS
75.0000 mg | ORAL_TABLET | Freq: Every day | ORAL | Status: DC
  Administered 2015-12-15: 10:00:00 75 mg via ORAL
  Filled 2015-12-14: qty 1

## 2015-12-14 MED ORDER — FAMOTIDINE IN NACL 20-0.9 MG/50ML-% IV SOLN
INTRAVENOUS | Status: DC | PRN
  Administered 2015-12-14: 20 mg via INTRAVENOUS

## 2015-12-14 MED ORDER — ASPIRIN EC 81 MG PO TBEC: 81.0000 mg | DELAYED_RELEASE_TABLET | Freq: Every day | ORAL | Status: DC

## 2015-12-14 MED ORDER — IOPAMIDOL (ISOVUE-370) INJECTION 76%
INTRAVENOUS | Status: DC | PRN
  Administered 2015-12-14: 170 mL via INTRA_ARTERIAL

## 2015-12-14 MED ORDER — LISINOPRIL 40 MG PO TABS
40.0000 mg | ORAL_TABLET | Freq: Every day | ORAL | Status: DC
  Administered 2015-12-14: 40 mg via ORAL
  Filled 2015-12-14: qty 1

## 2015-12-14 MED ORDER — SODIUM CHLORIDE 0.9 % IV SOLN: 250.0000 mL | INTRAVENOUS | Status: DC | PRN

## 2015-12-14 MED ORDER — DIPHENHYDRAMINE HCL 50 MG/ML IJ SOLN
INTRAMUSCULAR | Status: AC
  Filled 2015-12-14: qty 1

## 2015-12-14 MED ORDER — ASPIRIN 81 MG PO CHEW
81.0000 mg | CHEWABLE_TABLET | ORAL | Status: AC
  Administered 2015-12-14: 81 mg via ORAL

## 2015-12-14 MED ORDER — HYDRALAZINE HCL 20 MG/ML IJ SOLN: 10.0000 mg | INTRAMUSCULAR | Status: DC | PRN

## 2015-12-14 SURGICAL SUPPLY — 23 items
BALLN MOZEC 2.0X12 (BALLOONS) ×2
BALLN ~~LOC~~ EUPHORA RX 2.5X12 (BALLOONS) ×2
BALLN ~~LOC~~ TREK RX 3.25X12 (BALLOONS) ×2
BALLOON MOZEC 2.0X12 (BALLOONS) IMPLANT
BALLOON ~~LOC~~ EUPHORA RX 2.5X12 (BALLOONS) IMPLANT
BALLOON ~~LOC~~ TREK RX 3.25X12 (BALLOONS) IMPLANT
CATH EXPO 5F FL3.5 (CATHETERS) ×1 IMPLANT
CATH OPTITORQUE TIG 4.0 5F (CATHETERS) ×1 IMPLANT
CATH VISTA GUIDE 6FR XBLAD3.5 (CATHETERS) ×1 IMPLANT
DEVICE RAD COMP TR BAND LRG (VASCULAR PRODUCTS) ×1 IMPLANT
GLIDESHEATH SLEND A-KIT 6F 22G (SHEATH) ×1 IMPLANT
GUIDEWIRE INQWIRE 1.5J.035X260 (WIRE) IMPLANT
INQWIRE 1.5J .035X260CM (WIRE) ×2
KIT ENCORE 26 ADVANTAGE (KITS) ×1 IMPLANT
KIT HEART LEFT (KITS) ×2 IMPLANT
PACK CARDIAC CATHETERIZATION (CUSTOM PROCEDURE TRAY) ×2 IMPLANT
STENT RESOLUTE ONYX 2.25X15 (Permanent Stent) ×1 IMPLANT
STENT RESOLUTE ONYX 3.0X15 (Permanent Stent) ×1 IMPLANT
SYR MEDRAD MARK V 150ML (SYRINGE) ×2 IMPLANT
TRANSDUCER W/STOPCOCK (MISCELLANEOUS) ×2 IMPLANT
TUBING CIL FLEX 10 FLL-RA (TUBING) ×2 IMPLANT
WIRE ASAHI PROWATER 180CM (WIRE) ×1 IMPLANT
WIRE HI TORQ VERSACORE-J 145CM (WIRE) ×1 IMPLANT

## 2015-12-14 NOTE — Interval H&P Note (Signed)
Cath Lab Visit (complete for each Cath Lab visit)  Clinical Evaluation Leading to the Procedure:   ACS: No.  Non-ACS:    Anginal Classification: CCS II  Anti-ischemic medical therapy: No Therapy  Non-Invasive Test Results: No non-invasive testing performed  Prior CABG: No previous CABG      History and Physical Interval Note:  12/14/2015 10:03 AM  Martin Guzman  has presented today for surgery, with the diagnosis of cp - hp  The various methods of treatment have been discussed with the patient and family. After consideration of risks, benefits and other options for treatment, the patient has consented to  Procedure(s): Left Heart Cath and Coronary Angiography (N/A) as a surgical intervention .  The patient's history has been reviewed, patient examined, no change in status, stable for surgery.  I have reviewed the patient's chart and labs.  Questions were answered to the patient's satisfaction.     Quay Burow

## 2015-12-14 NOTE — Progress Notes (Signed)
Pt has home cpap and places self on/off.  Rt will monitor.

## 2015-12-14 NOTE — Progress Notes (Signed)
TR BAND REMOVAL  LOCATION:    right radial  DEFLATED PER PROTOCOL:    Yes.    TIME BAND OFF / DRESSING APPLIED:    2000   SITE UPON ARRIVAL:    Level 0  SITE AFTER BAND REMOVAL:    Level 0  CIRCULATION SENSATION AND MOVEMENT:    Within Normal Limits   Yes.    COMMENTS:   PT TOLERATED WELL POST RADIAL ACCESS TEACHING COMPLETED. TEACH BACK DONE. RE-VERBALIZES RADIAL SITE CARE FOR NEXT 24 HOURS CRITICAL, AND NEXT 5-7 DAYS.

## 2015-12-14 NOTE — H&P (View-Only) (Signed)
12/08/2015 Charlis Stuchell Bible   January 12, 1965  [id number]  Primary Physician GREEN, Keenan Bachelor, MD Primary Cardiologist: [name] MD [name]  HPI:  Mr. Metcalfe is a 50 year old mildly overweight married Caucasian male father of one 11 year old daughter accompanied by his wife Cindi today. He was referred by [name] as well as his PCP, [name] for cardiovascular evaluation because of new-onset chest pain. His risk factors include treated hypertension and hyperlipidemia untreated according to the chart. He does have obstructive sleep apnea on sleep. He does not smoke nor is he diabetic. He has a strong family history for heart disease with a mother who has apparently 33 stents by his account and a brother who had stenting at age 46. New-onset chest pain approximately 4 weeks ago has had 6 episodes since. Currently during times of stress and exercise with left upper extremity radiation. He did have a routine GXT at the hospital by [name] on 11/2015 which was nonischemic although he developed substernal chest pain relieved with sublingual nitroglycerin.   Current Outpatient Prescriptions  Medication Sig Dispense Refill  . ALPRAZolam (NIRAVAM) 0.25 MG dissolvable tablet Take 0.25 mg by mouth as needed.    . ARIPiprazole (ABILIFY) 2 MG tablet Take 2 mg by mouth daily.    Marland Kitchen aspirin 81 MG tablet Take 81 mg by mouth daily.    . Fluvoxamine Maleate 150 MG CP24 Take 150 mg by mouth daily.    . hydrochlorothiazide (HYDRODIURIL) 25 MG tablet Take 25 mg by mouth daily.    Marland Kitchen lisinopril (PRINIVIL,ZESTRIL) 40 MG tablet Take 40 mg by mouth daily.    . metoprolol succinate (TOPROL-XL) 50 MG 24 hr tablet Take 50 mg by mouth daily. Take with or immediately following a meal.    . testosterone cypionate (DEPOTESTOSTERONE CYPIONATE) 200 MG/ML injection Inject 200 mg into the muscle every 14 (fourteen) days.     No current facility-administered medications for this visit.     No  Known Allergies  Social History   Social History  . Marital status: Married    Spouse name: N/A  . Number of children: N/A  . Years of education: N/A   Occupational History  . Not on file.   Social History Main Topics  . Smoking status: Never Smoker  . Smokeless tobacco: Never Used  . Alcohol use Not on file  . Drug use: Unknown  . Sexual activity: Not on file   Other Topics Concern  . Not on file   Social History Narrative  . No narrative on file     Review of Systems: General: negative for chills, fever, night sweats or weight changes.  Cardiovascular: negative for chest pain, dyspnea on exertion, edema, orthopnea, palpitations, paroxysmal nocturnal dyspnea or shortness of breath Dermatological: negative for rash Respiratory: negative for cough or wheezing Urologic: negative for hematuria Abdominal: negative for nausea, vomiting, diarrhea, bright red blood per rectum, melena, or hematemesis Neurologic: negative for visual changes, syncope, or dizziness All other systems reviewed and are otherwise negative except as noted above.    Blood pressure 96/70, pulse 62, height 6' (1.829 m), weight (!) 308 lb (139.7 kg).  General appearance: alert and no distress Neck: no adenopathy, no carotid bruit, no JVD, supple, symmetrical, trachea midline and thyroid not enlarged, symmetric, no tenderness/mass/nodules Lungs: clear to auscultation bilaterally Heart: regular rate and rhythm, S1, S2 normal, no murmur, click, rub or gallop Extremities: extremities normal, atraumatic, no cyanosis or edema  EKG sinus rhythm at 62 poor R-wave progression. I personally reviewed this EKG  ASSESSMENT AND PLAN:   Essential hypertension History of hypertension with blood pressure measured today at 96/70. He is on hydrochlorothiazide, lisinopril and metoprolol. Continue current meds at current dosing  Hyperlipidemia History of hyperlipidemia not on statin therapy followed by his PCP  Chest  pain Mr. Defenbaugh was referred for evaluation of new onset chest pain. He is a 85-year-old mildly overweight Caucasian male with a strong family history of heart disease and history of hypertension. Currently his mother has had "66 stents" and a brother had a stent at age 42. He noticed new-onset chest pain approximately one month ago and has had problems with 6 episodes. These occur during times of stress and exertion with left upper extremity radiation. Recent routine GXT performed by [name] on 11/23/15 was negative for ischemia although the patient did have chest pain relieved with supplemental nitroglycerin. Based on his symptoms and risk factor profile I decided to proceed directly to the outpatient clinic catheterization via right radial approach. I have reviewed the risks, indications, and alternatives to cardiac catheterization, possible angioplasty, and stenting with the patient. Risks include but are not limited to bleeding, infection, vascular injury, stroke, myocardial infection, arrhythmia, kidney injury, radiation-related injury in the case of prolonged fluoroscopy use, emergency cardiac surgery, and death. The patient understands the risks of serious complication is 1-2 in 123XX123 with diagnostic cardiac cath and 1-2% or less with angioplasty/stenting.       [name] MD FACP,FACC,FAHA, [facility] 12/08/2015 10:35 AM

## 2015-12-15 ENCOUNTER — Encounter (HOSPITAL_COMMUNITY): Payer: Self-pay | Admitting: Cardiology

## 2015-12-15 DIAGNOSIS — I200 Unstable angina: Secondary | ICD-10-CM

## 2015-12-15 DIAGNOSIS — I10 Essential (primary) hypertension: Secondary | ICD-10-CM | POA: Diagnosis not present

## 2015-12-15 DIAGNOSIS — Z7982 Long term (current) use of aspirin: Secondary | ICD-10-CM | POA: Diagnosis not present

## 2015-12-15 DIAGNOSIS — E785 Hyperlipidemia, unspecified: Secondary | ICD-10-CM

## 2015-12-15 DIAGNOSIS — I2510 Atherosclerotic heart disease of native coronary artery without angina pectoris: Secondary | ICD-10-CM

## 2015-12-15 DIAGNOSIS — I25119 Atherosclerotic heart disease of native coronary artery with unspecified angina pectoris: Secondary | ICD-10-CM | POA: Diagnosis not present

## 2015-12-15 DIAGNOSIS — I959 Hypotension, unspecified: Secondary | ICD-10-CM | POA: Diagnosis not present

## 2015-12-15 DIAGNOSIS — Z8249 Family history of ischemic heart disease and other diseases of the circulatory system: Secondary | ICD-10-CM | POA: Diagnosis not present

## 2015-12-15 DIAGNOSIS — Z9861 Coronary angioplasty status: Secondary | ICD-10-CM

## 2015-12-15 LAB — BASIC METABOLIC PANEL
Anion gap: 11 (ref 5–15)
BUN: 20 mg/dL (ref 6–20)
CO2: 22 mmol/L (ref 22–32)
CREATININE: 1.41 mg/dL — AB (ref 0.61–1.24)
Calcium: 9.1 mg/dL (ref 8.9–10.3)
Chloride: 102 mmol/L (ref 101–111)
GFR calc non Af Amer: 57 mL/min — ABNORMAL LOW (ref >60)
Glucose, Bld: 145 mg/dL — ABNORMAL HIGH (ref 65–99)
Potassium: 4.2 mmol/L (ref 3.5–5.1)
SODIUM: 135 mmol/L (ref 135–145)

## 2015-12-15 LAB — CBC
HCT: 46.7 % (ref 39.0–52.0)
Hemoglobin: 16.4 g/dL (ref 13.0–17.0)
MCH: 30.5 pg (ref 26.0–34.0)
MCHC: 35.1 g/dL (ref 30.0–36.0)
MCV: 86.8 fL (ref 78.0–100.0)
PLATELETS: 170 10*3/uL (ref 150–400)
RBC: 5.38 MIL/uL (ref 4.22–5.81)
RDW: 13.7 % (ref 11.5–15.5)
WBC: 15.8 10*3/uL — ABNORMAL HIGH (ref 4.0–10.5)

## 2015-12-15 MED ORDER — ATORVASTATIN CALCIUM 80 MG PO TABS: 80.0000 mg | ORAL_TABLET | Freq: Every day | ORAL | 12 refills | Status: AC

## 2015-12-15 MED ORDER — CLOPIDOGREL BISULFATE 75 MG PO TABS: 75.0000 mg | ORAL_TABLET | Freq: Every day | ORAL | 12 refills | Status: AC

## 2015-12-15 NOTE — Progress Notes (Signed)
Work note given to patient at discharge.

## 2015-12-15 NOTE — Progress Notes (Signed)
CARDIAC REHAB PHASE I   Pt just walked with RN. No c/o, feels well. Ed completed with pt and wife. Voiced understanding and planning to get back to exercise. Understands importance of Plavix. Will send referral to [facility] however his work schedule might be problematic. [place], ACSM 12/15/2015 8:47 AM

## 2015-12-15 NOTE — Progress Notes (Signed)
Patient ambulated independently in hall with steady gait. Stated he felt alittle cloudy, "from the [place].", denies lightheaded feeling, dizziness, chest pain or shortness of breath. Blood pressure 94/43 at 0709 and 91/16 at 0753 prior to ambulating. Blood pressure 121/78 at 0815 after ambulating. Cardiac rehab into see patient for education.

## 2015-12-15 NOTE — Discharge Summary (Signed)
Discharge Summary    Patient ID: Martin Guzman,  MRN: [id number], DOB/AGE: Sep 20, 1965 50 y.o.  Admit date: 12/14/2015 Discharge date: 12/15/2015  Primary Care Provider: Criselda Peaches Primary Cardiologist: Dr. Gwenlyn Found  Discharge Diagnoses    Principal Problem:   Progressive angina [facility]) Active Problems:   Essential hypertension   Hyperlipidemia with target low density lipoprotein (LDL) cholesterol less than 70 mg/dL   Chest pain   CAD S/P PCI x 2 to LAD   Allergies Allergies  Allergen Reactions  . Bee Venom Other (See Comments)  . Shellfish Allergy Other (See Comments)    Lip numbing.     Diagnostic Studies/Procedures    Coronary Stent Intervention  Left Heart Cath and Coronary Angiography 12/14/15    Mid LAD lesion, 70 %stenosed.  Post intervention, there is a 0% residual stenosis.  A stent was successfully placed.  Dist LAD lesion, 99 %stenosed.  Post intervention, there is a 0% residual stenosis.  A stent was successfully placed.    _____________   History of Present Illness     Mr. Martin Guzman is a 50 year old male with a past medical history of HTN, HLD and OSA. He presented to Dr. Kennon Holter office by referral from his PCP for chest pain. He has a strong family history of heart disease. He reported 6 episodes of chest pain during exercise. He was referred for left heart cath.   Hospital Course     Left heart cath was preformed on 12/14/15, cath report above. He had a 70% mid LAD lesion and a 99% distal LAD lesion, both were treated with DES. The rest of his coronary arteries were free of disease.   He will be on DAPT with ASA and Plavix. Also will start him on a high intensity statin. He remain on his metoprolol, lisinopril. His HCTZ was stopped as he was slightly hypotensive during admission. Can restart at follow up if he is hypertensive.   Will need FLP and LFT's in 6 weeks. His right radial site was stable without hematoma.   He was chest pain  free while walking with cardiac rehab. He will follow up with Dr. Gwenlyn Found in the next 1-2 weeks.   He was seen today by Dr. Ellyn Hack and deemed suitable for discharge.    _____________  Discharge Vitals Blood pressure 121/78, pulse 84, temperature 97.2 F (36.2 C), temperature source Axillary, resp. rate (!) 22, height 6' (1.829 m), weight (!) 308 lb 10.3 oz (140 kg), SpO2 94 %.  Filed Weights   12/14/15 0848 12/15/15 0537  Weight: (!) 308 lb (139.7 kg) (!) 308 lb 10.3 oz (140 kg)    Labs & Radiologic Studies     CBC  Recent Labs  12/15/15 0225  WBC 15.8*  HGB 16.4  HCT 46.7  MCV 86.8  PLT 123XX123   Basic Metabolic Panel  Recent Labs  12/15/15 0225  NA 135  K 4.2  CL 102  CO2 22  GLUCOSE 145*  BUN 20  CREATININE 1.41*  CALCIUM 9.1    Dg Chest 2 View  Result Date: 12/08/2015 CLINICAL DATA:  Patient is pre procedure for heart catheterization. Hypertension and chest tightness. EXAM: CHEST  2 VIEW COMPARISON:  None. FINDINGS: The mediastinal contour is normal. The heart size is mildly enlarged. Both lungs are clear. The visualized skeletal structures are unremarkable. IMPRESSION: No active cardiopulmonary disease. Electronically Signed   By: Abelardo Diesel M.D.   On: 12/08/2015 12:23  Disposition   Pt is being discharged home today in good condition.  Follow-up Plans & Appointments    Follow-up Information    Quay Burow, MD Follow up on 12/22/2015.   Specialties:  Cardiology, Radiology Why:  at 8:00 am for hospital follow up  Contact information: [address] [place] [address] [phone]          Discharge Instructions    AMB Referral to Cardiac Rehabilitation - Phase II    Complete by:  As directed    Diagnosis:  Coronary Stents   Amb Referral to Cardiac Rehabilitation    Complete by:  As directed    Diagnosis:   PTCA Coronary Stents     Diet - low sodium heart healthy    Complete by:  As directed    Discharge  instructions    Complete by:  As directed    Radial Site Care Refer to this sheet in the next few weeks. These instructions provide you with information on caring for yourself after your procedure. Your caregiver may also give you more specific instructions. Your treatment has been planned according to current medical practices, but problems sometimes occur. Call your caregiver if you have any problems or questions after your procedure. HOME CARE INSTRUCTIONS You may shower the day after the procedure.Remove the bandage (dressing) and gently wash the site with plain soap and water.Gently pat the site dry.  Do not apply powder or lotion to the site.  Do not submerge the affected site in water for 3 to 5 days.  Inspect the site at least twice daily.  Do not flex or bend the affected arm for 24 hours.  No lifting over 5 pounds (2.3 kg) for 5 days after your procedure.  Do not drive home if you are discharged the same day of the procedure. Have someone else drive you.  You may drive 24 hours after the procedure unless otherwise instructed by your caregiver.  What to expect: Any bruising will usually fade within 1 to 2 weeks.  Blood that collects in the tissue (hematoma) may be painful to the touch. It should usually decrease in size and tenderness within 1 to 2 weeks.  SEEK IMMEDIATE MEDICAL CARE IF: You have unusual pain at the radial site.  You have redness, warmth, swelling, or pain at the radial site.  You have drainage (other than a small amount of blood on the dressing).  You have chills.  You have a fever or persistent symptoms for more than 72 hours.  You have a fever and your symptoms suddenly get worse.  Your arm becomes pale, cool, tingly, or numb.  You have heavy bleeding from the site. Hold pressure on the site.    NO HEAVY LIFTING OR SEXUAL ACTIVITY for 7 DAYS. NO DRIVING for 3-5 DAYS. NO SOAKING BATHS, HOT TUBS, POOLS, ETC., for 7 DAYS   Increase activity slowly    Complete  by:  As directed       Discharge Medications   Current Discharge Medication List    START taking these medications   Details  atorvastatin (LIPITOR) 80 MG tablet Take 1 tablet (80 mg total) by mouth daily at 10 pm. Qty: 30 tablet, Refills: 12    clopidogrel (PLAVIX) 75 MG tablet Take 1 tablet (75 mg total) by mouth daily with breakfast. Qty: 30 tablet, Refills: 12      CONTINUE these medications which have NOT CHANGED   Details  ALPRAZolam (NIRAVAM) 0.25 MG  dissolvable tablet Take 0.25 mg by mouth at bedtime as needed for anxiety.     ARIPiprazole (ABILIFY) 2 MG tablet Take 2 mg by mouth every evening.     aspirin 81 MG tablet Take 81 mg by mouth daily.    Cholecalciferol (VITAMIN D) 2000 units tablet Take 2,000 Units by mouth daily.    Fluvoxamine Maleate 150 MG CP24 Take 150 mg by mouth daily.    lisinopril (PRINIVIL,ZESTRIL) 40 MG tablet Take 40 mg by mouth daily.    metoprolol succinate (TOPROL-XL) 50 MG 24 hr tablet Take 50 mg by mouth daily. Take with or immediately following a meal.    testosterone cypionate (DEPOTESTOSTERONE CYPIONATE) 200 MG/ML injection Inject 200 mg into the muscle every 14 (fourteen) days.      STOP taking these medications     hydrochlorothiazide (HYDRODIURIL) 25 MG tablet          Aspirin prescribed at discharge?  Yes High Intensity Statin Prescribed? (Lipitor 40-80mg  or Crestor 20-40mg ): Yes Beta Blocker Prescribed? Yes For EF 40% or less, Was ACEI/ARB Prescribed? No: EF was not assessed ADP Receptor Inhibitor Prescribed? (i.e. Plavix etc.-Includes Medically Managed Patients): Yes For EF <40%, Aldosterone Inhibitor Prescribed? No: EF not assessed  Was EF assessed during THIS hospitalization? No:  Was Cardiac Rehab II ordered? (Included Medically managed Patients): Yes   Outstanding Labs/Studies   FLP and LFT's in 6 weeks.   Duration of Discharge Encounter   Greater than 30 minutes including physician time.  Signed, Arbutus Leas NP 12/15/2015, 10:01 AM

## 2015-12-22 ENCOUNTER — Encounter: Payer: Self-pay | Admitting: Cardiovascular Disease

## 2015-12-22 ENCOUNTER — Other Ambulatory Visit: Payer: Self-pay | Admitting: Cardiovascular Disease

## 2015-12-22 ENCOUNTER — Telehealth: Payer: Self-pay | Admitting: Cardiovascular Disease

## 2015-12-22 ENCOUNTER — Ambulatory Visit (INDEPENDENT_AMBULATORY_CARE_PROVIDER_SITE_OTHER): Payer: BLUE CROSS/BLUE SHIELD | Admitting: Cardiovascular Disease

## 2015-12-22 VITALS — BP 110/72 | HR 59 | Ht 72.0 in | Wt 310.2 lb

## 2015-12-22 DIAGNOSIS — Z9861 Coronary angioplasty status: Secondary | ICD-10-CM

## 2015-12-22 DIAGNOSIS — E785 Hyperlipidemia, unspecified: Secondary | ICD-10-CM | POA: Diagnosis not present

## 2015-12-22 DIAGNOSIS — I10 Essential (primary) hypertension: Secondary | ICD-10-CM

## 2015-12-22 DIAGNOSIS — I2510 Atherosclerotic heart disease of native coronary artery without angina pectoris: Secondary | ICD-10-CM

## 2015-12-22 NOTE — Telephone Encounter (Signed)
Called Dr. Rolly Salter office to obtain Lipid panels done within the last year. [name] spoke with me and stated that Mr. Martin Guzman was seeing another provider from 2005-May 2017. He did have records of Lipid panel done in March 2017. [name] stated HDL--29, LDL--128, Total Cholesterol--235, Triglycerides--390.  Thanked [name] and asked if they could also have that faxed over so we could scan it into pt chart. He stated he will have it sent over today.

## 2015-12-22 NOTE — Assessment & Plan Note (Signed)
History of CAD status post mid and distal LAD PCI and drug-eluting stenting by myself 12/14/2015 performed via the right radial approach. He feels clinically improved. He has more energy and no longer has anginal chest pain. He remains on dual antiplatelet therapy.

## 2015-12-22 NOTE — Patient Instructions (Signed)
Medication Instructions: Your physician recommends that you continue on your current medications as directed. Please refer to the Current Medication list given to you today.   Labwork: I will request lab work from Dr. Rolly Salter office.   Follow-Up: Your physician wants you to follow-up in: 6 months with Dr. Gwenlyn Found. You will receive a reminder letter in the mail two months in advance. If you don't receive a letter, please call our office to schedule the follow-up appointment.  If you need a refill on your cardiac medications before your next appointment, please call your pharmacy.

## 2015-12-22 NOTE — Telephone Encounter (Signed)
No problem! Repeat orders entered, will call pt to inform him and mail lab slips.

## 2015-12-22 NOTE — Assessment & Plan Note (Signed)
History of hyperlipidemia on high-dose statin therapy followed by his PCP. 

## 2015-12-22 NOTE — Progress Notes (Signed)
12/22/2015 Martin Guzman   [birth year]  [id number]  Primary Physician GREEN, Martin Bachelor, MD Primary Cardiologist: [name] MD [name]  HPI:  Mr. Martin Guzman is a 50 year old mildly overweight married Caucasian male father of one 49 year old daughter accompanied by his wife Martin Guzman. I last saw him in the office 12/08/15. He was referred by Martin Guzman as well as his PCP, Dr. Nyoka Guzman for cardiovascular evaluation because of new-onset chest pain. His risk factors include treated hypertension and hyperlipidemia untreated according to the chart. He does have obstructive sleep apnea on sleep. He does not smoke nor is he diabetic. He has a strong family history for heart disease with a mother who has apparently 67 stents by his account and a brother who had stenting at age 5. New-onset chest pain approximately 4 weeks ago has had 6 episodes since. Currently during times of stress and exercise with left upper extremity radiation. He did have a routine GXT at the hospital by Dr. Nyoka Guzman on 11/2015 which was nonischemic although he developed substernal chest pain relieved with sublingual nitroglycerin. I elected to proceed with outpatient radial diagnostic coronary angiography to define his anatomy on 12/14/15 revealing 70% mid and 99% distal LAD stenoses both of which were stented with drug-eluting stents. He feels currently improved. His wife Martin Guzman  says he has more energy and he no longer has anginal chest pain.    Current Outpatient Prescriptions  Medication Sig Dispense Refill  . ALPRAZolam (NIRAVAM) 0.25 MG dissolvable tablet Take 0.25 mg by mouth at bedtime as needed for anxiety.     . ARIPiprazole (ABILIFY) 2 MG tablet Take 2 mg by mouth every evening.     Marland Kitchen aspirin 81 MG tablet Take 81 mg by mouth daily.    Marland Kitchen atorvastatin (LIPITOR) 80 MG tablet Take 1 tablet (80 mg total) by mouth daily at 10 pm. 30 tablet 12  . Cholecalciferol (VITAMIN D) 2000 units tablet Take 2,000  Units by mouth daily.    . clopidogrel (PLAVIX) 75 MG tablet Take 1 tablet (75 mg total) by mouth daily with breakfast. 30 tablet 12  . Fluvoxamine Maleate 150 MG CP24 Take 150 mg by mouth daily.    Marland Kitchen lisinopril (PRINIVIL,ZESTRIL) 40 MG tablet Take 40 mg by mouth daily.    . metoprolol succinate (TOPROL-XL) 50 MG 24 hr tablet Take 50 mg by mouth daily. Take with or immediately following a meal.    . nitroGLYCERIN (NITROSTAT) 0.4 MG SL tablet Place 0.4 mg under the tongue every 5 (five) minutes as needed for chest pain.    Marland Kitchen testosterone cypionate (DEPOTESTOSTERONE CYPIONATE) 200 MG/ML injection Inject 200 mg into the muscle every 14 (fourteen) days.     No current facility-administered medications for this visit.     Allergies  Allergen Reactions  . Bee Venom Other (See Comments)  . Shellfish Allergy Other (See Comments)    Lip numbing.     Social History   Social History  . Marital status: Married    Spouse name: N/A  . Number of children: N/A  . Years of education: N/A   Occupational History  . Not on file.   Social History Main Topics  . Smoking status: Never Smoker  . Smokeless tobacco: Never Used  . Alcohol use Yes     Comment: OCC  . Drug use: No  . Sexual activity: Not on file   Other Topics Concern  . Not on file  Social History Narrative  . No narrative on file     Review of Systems: General: negative for chills, fever, night sweats or weight changes.  Cardiovascular: negative for chest pain, dyspnea on exertion, edema, orthopnea, palpitations, paroxysmal nocturnal dyspnea or shortness of breath Dermatological: negative for rash Respiratory: negative for cough or wheezing Urologic: negative for hematuria Abdominal: negative for nausea, vomiting, diarrhea, bright red blood per rectum, melena, or hematemesis Neurologic: negative for visual changes, syncope, or dizziness All other systems reviewed and are otherwise negative except as noted  above.    Blood pressure 110/72, pulse (!) 59, height 6' (1.829 m), weight (!) 310 lb 3.2 oz (140.7 kg).  General appearance: alert and no distress Neck: no adenopathy, no carotid bruit, no JVD, supple, symmetrical, trachea midline and thyroid not enlarged, symmetric, no tenderness/mass/nodules Lungs: clear to auscultation bilaterally Heart: regular rate and rhythm, S1, S2 normal, no murmur, click, rub or gallop ExtremitieHis right radial puncture site is well healed. He does have what appears to be a thrombosed superficial vein in his left forearm.extremities normal, atraumatic, no cyanosis or edema and His right radial puncture site is well healed. He does have what appears to be a thrombosed superficial vein in his left forearm.  EKG sinus bradycardia 59 without ST or T-wave changes. I personally reviewed this EKG  ASSESSMENT AND PLAN:   Essential hypertension History of hypertension with blood pressure measured 96/70. He is on lisinopril and metoprolol. Continue current meds are Cardizem  Hyperlipidemia with target low density lipoprotein (LDL) cholesterol less than 70 mg/dL History of hyperlipidemia on high-dose statin therapy followed by his PCP  CAD S/P PCI x 2 to LAD History of CAD status post mid and distal LAD PCI and drug-eluting stenting by myself 12/14/2015 performed via the right radial approach. He feels clinically improved. He has more energy and no longer has anginal chest pain. He remains on dual antiplatelet therapy.      [name] MD FACP,FACC,FAHA, [facility] 12/22/2015 8:21 AM

## 2015-12-22 NOTE — Telephone Encounter (Signed)
Thx for doing that. Let's repeat a lipid and liver level

## 2015-12-22 NOTE — Assessment & Plan Note (Signed)
History of hypertension with blood pressure measured 96/70. He is on lisinopril and metoprolol. Continue current meds are Cardizem

## 2015-12-30 DIAGNOSIS — I10 Essential (primary) hypertension: Secondary | ICD-10-CM | POA: Diagnosis not present

## 2015-12-30 DIAGNOSIS — I2510 Atherosclerotic heart disease of native coronary artery without angina pectoris: Secondary | ICD-10-CM | POA: Diagnosis not present

## 2016-01-13 DIAGNOSIS — I2510 Atherosclerotic heart disease of native coronary artery without angina pectoris: Secondary | ICD-10-CM | POA: Diagnosis not present

## 2016-01-13 DIAGNOSIS — I10 Essential (primary) hypertension: Secondary | ICD-10-CM | POA: Diagnosis not present

## 2016-01-20 ENCOUNTER — Telehealth (HOSPITAL_COMMUNITY): Payer: Self-pay | Admitting: *Deleted

## 2016-01-20 NOTE — Telephone Encounter (Signed)
Pt contacted for sign up to cardiac rehab.  Pt declines at this time. Pt is already exercising consistently. Cherre Huger, BSN

## 2016-02-22 ENCOUNTER — Encounter: Payer: Self-pay | Admitting: Emergency Medicine

## 2016-02-22 ENCOUNTER — Ambulatory Visit (INDEPENDENT_AMBULATORY_CARE_PROVIDER_SITE_OTHER): Payer: BLUE CROSS/BLUE SHIELD | Admitting: Emergency Medicine

## 2016-02-22 DIAGNOSIS — G4733 Obstructive sleep apnea (adult) (pediatric): Secondary | ICD-10-CM | POA: Diagnosis not present

## 2016-02-22 NOTE — Progress Notes (Signed)
Subjective:    Patient ID: Martin Guzman, male    DOB: 10-Mar-1965, 51 y.o.   MRN: [id number]  HPI 51 year old man with a history of coronary artery disease (Dr. Gwenlyn Found), anxiety, hypertension, arthritis. He has been followed in our office by Dr Gwenette Greet for OSA. Last seen here in 2009. He has been treated with CPAP. He reports that his compliance is great. Unfortunately his machine (51 yrs old) broke down last week. He has missed it significantly. Day time sleepiness better when using CPAP. Still has mild snoring during the night on CPAP.  Epworth score today 12.    Review of Systems  Constitutional: Positive for fatigue. Negative for fever and unexpected weight change.  HENT: Negative for congestion, dental problem, ear pain, nosebleeds, postnasal drip, rhinorrhea, sinus pressure, sneezing, sore throat and trouble swallowing.   Eyes: Negative for redness and itching.  Respiratory: Negative for cough, chest tightness, shortness of breath and wheezing.   Cardiovascular: Negative for palpitations and leg swelling.  Gastrointestinal: Negative for nausea and vomiting.  Genitourinary: Negative for dysuria.  Musculoskeletal: Negative for joint swelling.  Skin: Negative for rash.  Neurological: Negative for headaches.  Hematological: Does not bruise/bleed easily.  Psychiatric/Behavioral: Negative for dysphoric mood. The patient is not nervous/anxious.    Past Medical History:  Diagnosis Date  . Anxiety   . Anxiety and depression   . Arthritis   . CAD S/P PCI x 2 to LAD 12/14/2015   Mid LAD lesion, 70 %stenosed. --> ([facility] [place] 3.0 x15), Dist LAD lesion, 99 %stenosed --> ([place] 2.25x15)   . Coronary artery disease   . Depression   . GERD (gastroesophageal reflux disease)   . Headache   . Hypertension   . Progressive angina ([place]) 12/14/2015   Cardiac Cath with m & d LAD lesions - PCI x 2 DES stents     Family History  Problem Relation Age of Onset  . Heart failure Mother   .  Hypertension Mother      Social History   Social History  . Marital status: Married    Spouse name: N/A  . Number of children: N/A  . Years of education: N/A   Occupational History  . Not on file.   Social History Main Topics  . Smoking status: Never Smoker  . Smokeless tobacco: Never Used  . Alcohol use Yes     Comment: OCC  . Drug use: No  . Sexual activity: Not on file   Other Topics Concern  . Not on file   Social History Narrative  . No narrative on file  works as a Dealer.   Allergies  Allergen Reactions  . Bee Venom Other (See Comments)  . Shellfish Allergy Other (See Comments)    Lip numbing.      Outpatient Medications Prior to Visit  Medication Sig Dispense Refill  . ALPRAZolam (NIRAVAM) 0.25 MG dissolvable tablet Take 0.25 mg by mouth at bedtime as needed for anxiety.     . ARIPiprazole (ABILIFY) 2 MG tablet Take 2 mg by mouth every evening.     Marland Kitchen aspirin 81 MG tablet Take 81 mg by mouth daily.    Marland Kitchen atorvastatin (LIPITOR) 80 MG tablet Take 1 tablet (80 mg total) by mouth daily at 10 pm. 30 tablet 12  . Cholecalciferol (VITAMIN D) 2000 units tablet Take 2,000 Units by mouth daily.    . clopidogrel (PLAVIX) 75 MG tablet Take 1 tablet (75 mg total) by mouth daily with  breakfast. 30 tablet 12  . Fluvoxamine Maleate 150 MG CP24 Take 150 mg by mouth daily.    Marland Kitchen lisinopril (PRINIVIL,ZESTRIL) 40 MG tablet Take 40 mg by mouth daily.    . metoprolol succinate (TOPROL-XL) 50 MG 24 hr tablet Take 50 mg by mouth daily. Take with or immediately following a meal.    . nitroGLYCERIN (NITROSTAT) 0.4 MG SL tablet Place 0.4 mg under the tongue every 5 (five) minutes as needed for chest pain.    Marland Kitchen testosterone cypionate (DEPOTESTOSTERONE CYPIONATE) 200 MG/ML injection Inject 200 mg into the muscle every 14 (fourteen) days.     No facility-administered medications prior to visit.         Objective:   Physical Exam Vitals:   02/22/16 1330  BP: 130/84  Pulse: 76    SpO2: 97%  Weight: (!) 311 lb (141.1 kg)  Height: 6' (1.829 m)   Gen: Pleasant, overweight man, in no distress,  normal affect  ENT: No lesions,  mouth clear,  oropharynx clear, no postnasal drip  Neck: No JVD, no TMG, no carotid bruits  Lungs: No use of accessory muscles, clear without rales or rhonchi  Cardiovascular: RRR, heart sounds normal, no murmur or gallops, trace peripheral edema  Musculoskeletal: No deformities, no cyanosis or clubbing  Neuro: alert, non focal  Skin: Warm, no lesions or rash      Assessment & Plan:  OSA (obstructive sleep apnea) Patient with documented obstructive sleep apnea from 2008. He has not followed up regularly but he indicates that he's had great compliance with his mask with good clinical response. Unfortunately his device is now malfunctioning and he needs a new one. I will get this through [facility] after I find a copy of his original sleep study. Then see him in 2 months to document compliance.   Baltazar Apo, MD, PhD 02/22/2016, 1:55 PM [facility] [phone] or if no answer [phone]

## 2016-02-22 NOTE — Assessment & Plan Note (Signed)
Patient with documented obstructive sleep apnea from 2008. He has not followed up regularly but he indicates that he's had great compliance with his mask with good clinical response. Unfortunately his device is now malfunctioning and he needs a new one. I will get this through [facility] after I find a copy of his original sleep study. Then see him in 2 months to document compliance.

## 2016-02-22 NOTE — Progress Notes (Signed)
   Subjective:    Patient ID: Martin Guzman, male    DOB: April 27, 1965, 51 y.o.   MRN: [id number]  HPI    Review of Systems     Objective:   Physical Exam        Assessment & Plan:

## 2016-02-22 NOTE — Patient Instructions (Addendum)
We will work on getting you a new CPAP machine through [facility]. Nasal pillows mask with all equipment, heated humidity.   Please follow up in 2 months to review your status on the new machine.

## 2016-02-26 DIAGNOSIS — G4733 Obstructive sleep apnea (adult) (pediatric): Secondary | ICD-10-CM | POA: Diagnosis not present

## 2016-03-07 ENCOUNTER — Telehealth: Payer: Self-pay | Admitting: Emergency Medicine

## 2016-03-07 NOTE — Telephone Encounter (Signed)
CMN was received and signed by RB. CMN has been given back to [place] to fax back. Angie at [facility] is aware of this. Nothing further was needed.

## 2016-03-25 DIAGNOSIS — G4733 Obstructive sleep apnea (adult) (pediatric): Secondary | ICD-10-CM | POA: Diagnosis not present

## 2016-04-25 DIAGNOSIS — G4733 Obstructive sleep apnea (adult) (pediatric): Secondary | ICD-10-CM | POA: Diagnosis not present

## 2016-04-27 ENCOUNTER — Ambulatory Visit: Payer: BLUE CROSS/BLUE SHIELD | Admitting: Emergency Medicine

## 2016-05-09 DIAGNOSIS — Z0000 Encounter for general adult medical examination without abnormal findings: Secondary | ICD-10-CM | POA: Diagnosis not present

## 2016-05-09 DIAGNOSIS — I10 Essential (primary) hypertension: Secondary | ICD-10-CM | POA: Diagnosis not present

## 2016-05-09 DIAGNOSIS — Z6841 Body Mass Index (BMI) 40.0 and over, adult: Secondary | ICD-10-CM | POA: Diagnosis not present

## 2016-05-09 DIAGNOSIS — G4730 Sleep apnea, unspecified: Secondary | ICD-10-CM | POA: Diagnosis not present

## 2016-05-09 DIAGNOSIS — I2510 Atherosclerotic heart disease of native coronary artery without angina pectoris: Secondary | ICD-10-CM | POA: Diagnosis not present

## 2016-05-10 ENCOUNTER — Ambulatory Visit (INDEPENDENT_AMBULATORY_CARE_PROVIDER_SITE_OTHER): Payer: BLUE CROSS/BLUE SHIELD | Admitting: Emergency Medicine

## 2016-05-10 ENCOUNTER — Encounter: Payer: Self-pay | Admitting: Emergency Medicine

## 2016-05-10 DIAGNOSIS — G4733 Obstructive sleep apnea (adult) (pediatric): Secondary | ICD-10-CM | POA: Diagnosis not present

## 2016-05-10 NOTE — Patient Instructions (Signed)
Please continue to wear your CPAP every night [facility] should sent your replacement parts intermittently (hoses, filters, masks, reservoir).   Call us for any problems with your device, or for any problems with your breathing at night.  Follow with Dr Lamonte Sakai in 12 months or sooner if you have any problems

## 2016-05-10 NOTE — Assessment & Plan Note (Signed)
Please continue to wear your CPAP every night [facility] should sent your replacement parts intermittently (hoses, filters, masks, reservoir).   Call us for any problems with your device, or for any problems with your breathing at night.  Follow with Dr Lamonte Sakai in 12 months or sooner if you have any problems

## 2016-05-10 NOTE — Progress Notes (Signed)
Subjective:    Patient ID: Martin Guzman, male    DOB: September 12, 1965, 51 y.o.   MRN: [id number]  HPI 51 year old man with a history of coronary artery disease (Dr. Gwenlyn Found), anxiety, hypertension, arthritis. He has been followed in our office by Dr Gwenette Greet for OSA. Last seen here in 2009. He has been treated with CPAP. He reports that his compliance is great. Unfortunately his machine (51 yrs old) broke down last week. He has missed it significantly. Day time sleepiness better when using CPAP. Still has mild snoring during the night on CPAP.  Epworth score today 12.   ROV 05/10/16 -- This is a follow-up visit for obstructive sleep apnea. He reestablished care with [place] on 02/22/16. He used to be seen by Dr Gwenette Greet. He was able to get new machine, new equipment, from [facility]. He has great compliance as documented by his download information today. He's been using it for 100% of the nights, 97% of the nights for greater than 4 hours. His AHI is 1.7 on a pressure of 10 cm H2O. minimal leak. He feels significant clinical benefit, is less sleepy, does not nap, has more energy.   Review of Systems  Constitutional: Positive for fatigue. Negative for fever and unexpected weight change.  HENT: Negative for congestion, dental problem, ear pain, nosebleeds, postnasal drip, rhinorrhea, sinus pressure, sneezing, sore throat and trouble swallowing.   Eyes: Negative for redness and itching.  Respiratory: Negative for cough, chest tightness, shortness of breath and wheezing.   Cardiovascular: Negative for palpitations and leg swelling.  Gastrointestinal: Negative for nausea and vomiting.  Genitourinary: Negative for dysuria.  Musculoskeletal: Negative for joint swelling.  Skin: Negative for rash.  Neurological: Negative for headaches.  Hematological: Does not bruise/bleed easily.  Psychiatric/Behavioral: Negative for dysphoric mood. The patient is not nervous/anxious.    Past Medical History:  Diagnosis Date    . Anxiety   . Anxiety and depression   . Arthritis   . CAD S/P PCI x 2 to LAD 12/14/2015   Mid LAD lesion, 70 %stenosed. --> (Resolute Onyx 3.0 x15), Dist LAD lesion, 99 %stenosed --> (Resolute Onyx 2.25x15)   . Coronary artery disease   . Depression   . GERD (gastroesophageal reflux disease)   . Headache   . Hypertension   . Progressive angina ([place]) 12/14/2015   Cardiac Cath with m & d LAD lesions - PCI x 2 DES stents     Family History  Problem Relation Age of Onset  . Heart failure Mother   . Hypertension Mother      Social History   Social History  . Marital status: Married    Spouse name: N/A  . Number of children: N/A  . Years of education: N/A   Occupational History  . Not on file.   Social History Main Topics  . Smoking status: Never Smoker  . Smokeless tobacco: Never Used  . Alcohol use Yes     Comment: OCC  . Drug use: No  . Sexual activity: Not on file   Other Topics Concern  . Not on file   Social History Narrative  . No narrative on file  works as a Dealer.   Allergies  Allergen Reactions  . Bee Venom Other (See Comments)  . Shellfish Allergy Other (See Comments)    Lip numbing.      Outpatient Medications Prior to Visit  Medication Sig Dispense Refill  . ALPRAZolam (NIRAVAM) 0.25 MG dissolvable tablet Take 0.25 mg  by mouth at bedtime as needed for anxiety.     . ARIPiprazole (ABILIFY) 2 MG tablet Take 2 mg by mouth every evening.     Marland Kitchen aspirin 81 MG tablet Take 81 mg by mouth daily.    Marland Kitchen atorvastatin (LIPITOR) 80 MG tablet Take 1 tablet (80 mg total) by mouth daily at 10 pm. 30 tablet 12  . Cholecalciferol (VITAMIN D) 2000 units tablet Take 2,000 Units by mouth daily.    . clopidogrel (PLAVIX) 75 MG tablet Take 1 tablet (75 mg total) by mouth daily with breakfast. 30 tablet 12  . Fluvoxamine Maleate 150 MG CP24 Take 150 mg by mouth daily.    Marland Kitchen lisinopril (PRINIVIL,ZESTRIL) 40 MG tablet Take 40 mg by mouth daily.    . metoprolol  succinate (TOPROL-XL) 50 MG 24 hr tablet Take 50 mg by mouth daily. Take with or immediately following a meal.    . nitroGLYCERIN (NITROSTAT) 0.4 MG SL tablet Place 0.4 mg under the tongue every 5 (five) minutes as needed for chest pain.    Marland Kitchen testosterone cypionate (DEPOTESTOSTERONE CYPIONATE) 200 MG/ML injection Inject 200 mg into the muscle every 14 (fourteen) days.     No facility-administered medications prior to visit.         Objective:   Physical Exam Vitals:   05/10/16 1616  BP: 118/80  Pulse: 71  SpO2: 95%  Weight: (!) 302 lb (137 kg)  Height: 6' (1.829 m)   Gen: Pleasant, overweight man, in no distress,  normal affect  ENT: No lesions,  mouth clear,  oropharynx clear, no postnasal drip  Neck: No JVD, no TMG, no carotid bruits  Lungs: No use of accessory muscles, clear without rales or rhonchi  Cardiovascular: RRR, heart sounds normal, no murmur or gallops, trace peripheral edema  Musculoskeletal: No deformities, no cyanosis or clubbing  Neuro: alert, non focal  Skin: Warm, no lesions or rash      Assessment & Plan:  OSA (obstructive sleep apnea) Please continue to wear your CPAP every night [facility] should sent your replacement parts intermittently (hoses, filters, masks, reservoir).   Call us for any problems with your device, or for any problems with your breathing at night.  Follow with Dr Lamonte Sakai in 12 months or sooner if you have any problems  Baltazar Apo, MD, PhD 05/10/2016, 4:42 PM [facility] [phone] or if no answer [phone]

## 2016-05-25 DIAGNOSIS — G4733 Obstructive sleep apnea (adult) (pediatric): Secondary | ICD-10-CM | POA: Diagnosis not present

## 2016-06-14 ENCOUNTER — Encounter: Payer: Self-pay | Admitting: Internal Medicine

## 2016-06-21 ENCOUNTER — Encounter: Payer: Self-pay | Admitting: Cardiovascular Disease

## 2016-06-21 ENCOUNTER — Ambulatory Visit (INDEPENDENT_AMBULATORY_CARE_PROVIDER_SITE_OTHER): Payer: BLUE CROSS/BLUE SHIELD | Admitting: Cardiovascular Disease

## 2016-06-21 VITALS — BP 126/62 | HR 66 | Ht 72.0 in | Wt 313.0 lb

## 2016-06-21 DIAGNOSIS — E785 Hyperlipidemia, unspecified: Secondary | ICD-10-CM | POA: Diagnosis not present

## 2016-06-21 DIAGNOSIS — I2510 Atherosclerotic heart disease of native coronary artery without angina pectoris: Secondary | ICD-10-CM

## 2016-06-21 DIAGNOSIS — Z9861 Coronary angioplasty status: Secondary | ICD-10-CM | POA: Diagnosis not present

## 2016-06-21 DIAGNOSIS — I10 Essential (primary) hypertension: Secondary | ICD-10-CM | POA: Diagnosis not present

## 2016-06-21 DIAGNOSIS — G4733 Obstructive sleep apnea (adult) (pediatric): Secondary | ICD-10-CM | POA: Diagnosis not present

## 2016-06-21 NOTE — Progress Notes (Signed)
06/21/2016 Martin Guzman   [birth year]  [id number]  Primary Physician Levin Erp, MD Primary Cardiologist: [name] MD [name]  HPI:   Mr. Ransford is a 51 year old mildly overweight married Caucasian male father of one 34 year old daughter . I last saw him in the office 12/22/15. He was referred by [name] as well as his PCP, [name] for cardiovascular evaluation because of new-onset chest pain. His risk factors include treated hypertension and hyperlipidemia untreated according to the chart. He does have obstructive sleep apnea on sleep. He does not smoke nor is he diabetic. He has a strong family history for heart disease with a mother who has apparently 19 stents by his account and a brother who had stenting at age 56. New-onset chest pain approximately 4 weeks ago has had 6 episodes since. Currently during times of stress and exercise with left upper extremity radiation. He did have a routine GXT at the hospital by [name] on 11/2015 which was nonischemic although he developed substernal chest pain relieved with sublingual nitroglycerin. I elected to proceed with outpatient radial diagnostic coronary angiography to define his anatomy on 12/14/15 revealing 70% mid and 99% distal LAD stenoses both of which were stented with drug-eluting stents. He feels currently improved. His wife [name]  says he has more energy and he no longer has anginal chest pain. Since I saw him 6 months ago he's had no recurrent symptoms.   Current Outpatient Prescriptions  Medication Sig Dispense Refill  . ALPRAZolam (NIRAVAM) 0.25 MG dissolvable tablet Take 0.25 mg by mouth at bedtime as needed for anxiety.     . ARIPiprazole (ABILIFY) 2 MG tablet Take 2 mg by mouth every evening.     Marland Kitchen aspirin 81 MG tablet Take 81 mg by mouth daily.    Marland Kitchen atorvastatin (LIPITOR) 80 MG tablet Take 1 tablet (80 mg total) by mouth daily at 10 pm. 30 tablet 12  . Cholecalciferol (VITAMIN D) 2000  units tablet Take 2,000 Units by mouth daily.    . clopidogrel (PLAVIX) 75 MG tablet Take 1 tablet (75 mg total) by mouth daily with breakfast. 30 tablet 12  . Fluvoxamine Maleate 150 MG CP24 Take 150 mg by mouth daily.    Marland Kitchen lisinopril (PRINIVIL,ZESTRIL) 40 MG tablet Take 40 mg by mouth daily.    . metoprolol succinate (TOPROL-XL) 50 MG 24 hr tablet Take 50 mg by mouth daily. Take with or immediately following a meal.    . nitroGLYCERIN (NITROSTAT) 0.4 MG SL tablet Place 0.4 mg under the tongue every 5 (five) minutes as needed for chest pain.    Marland Kitchen testosterone cypionate (DEPOTESTOSTERONE CYPIONATE) 200 MG/ML injection Inject 200 mg into the muscle every 14 (fourteen) days.     No current facility-administered medications for this visit.     Allergies  Allergen Reactions  . Bee Venom Other (See Comments)  . Shellfish Allergy Other (See Comments)    Lip numbing.     Social History   Social History  . Marital status: Married    Spouse name: N/A  . Number of children: N/A  . Years of education: N/A   Occupational History  . Not on file.   Social History Main Topics  . Smoking status: Never Smoker  . Smokeless tobacco: Never Used  . Alcohol use Yes     Comment: OCC  . Drug use: No  . Sexual activity: Not on file   Other Topics Concern  .  Not on file   Social History Narrative  . No narrative on file     Review of Systems: General: negative for chills, fever, night sweats or weight changes.  Cardiovascular: negative for chest pain, dyspnea on exertion, edema, orthopnea, palpitations, paroxysmal nocturnal dyspnea or shortness of breath Dermatological: negative for rash Respiratory: negative for cough or wheezing Urologic: negative for hematuria Abdominal: negative for nausea, vomiting, diarrhea, bright red blood per rectum, melena, or hematemesis Neurologic: negative for visual changes, syncope, or dizziness All other systems reviewed and are otherwise negative except as  noted above.    Blood pressure 126/62, pulse 66, height 6' (1.829 m), weight (!) 313 lb (142 kg).  General appearance: alert and no distress Neck: no adenopathy, no carotid bruit, no JVD, supple, symmetrical, trachea midline and thyroid not enlarged, symmetric, no tenderness/mass/nodules Lungs: clear to auscultation bilaterally Heart: regular rate and rhythm, S1, S2 normal, no murmur, click, rub or gallop Extremities: extremities normal, atraumatic, no cyanosis or edema  EKG sinus rhythm at 66 without ST or T-wave changes. I personally reviewed this EKG  ASSESSMENT AND PLAN:   Essential hypertension History of hypertension with blood pressure measured 126/62. He is on lisinopril and metoprolol. Continue current meds at current dosing.  Hyperlipidemia with target low density lipoprotein (LDL) cholesterol less than 70 mg/dL History of hyperlipidemia on statin therapy followed by his PCP  OSA (obstructive sleep apnea) History of obstructive sleep apnea on CPAP which he benefits from  CAD S/P PCI x 2 to LAD History of CAD status post mid and distal LAD PCI and stenting by myself 12/14/15 with Medtronic Onyx drug-eluting stents. Since that time he's had no recurrent angina.      [name] MD FACP,FACC,FAHA, [facility] 06/21/2016 4:17 PM

## 2016-06-21 NOTE — Assessment & Plan Note (Signed)
History of CAD status post mid and distal LAD PCI and stenting by myself 12/14/15 with Medtronic Onyx drug-eluting stents. Since that time he's had no recurrent angina.

## 2016-06-21 NOTE — Assessment & Plan Note (Signed)
History of hypertension with blood pressure measured 126/62. He is on lisinopril and metoprolol. Continue current meds at current dosing.

## 2016-06-21 NOTE — Assessment & Plan Note (Signed)
History of obstructive sleep apnea on CPAP which he benefits from 

## 2016-06-21 NOTE — Assessment & Plan Note (Signed)
History of hyperlipidemia on statin therapy followed by his PCP 

## 2016-06-21 NOTE — Patient Instructions (Signed)

## 2016-06-25 DIAGNOSIS — G4733 Obstructive sleep apnea (adult) (pediatric): Secondary | ICD-10-CM | POA: Diagnosis not present

## 2016-07-25 DIAGNOSIS — G4733 Obstructive sleep apnea (adult) (pediatric): Secondary | ICD-10-CM | POA: Diagnosis not present

## 2016-08-09 ENCOUNTER — Telehealth: Payer: Self-pay | Admitting: *Deleted

## 2016-08-09 NOTE — Telephone Encounter (Signed)
Called patient and left message for him to call [phone].  Patient is on Plavix with no GI Hx and needs o.v.  Pamala Hurry PV

## 2016-08-09 NOTE — Telephone Encounter (Signed)
Spoke with pt- he states he was not aware his clopidogrel was a blood thinner- he does take this daily as directed- explained we need to have him seen in the office prior to his 8-30 colon due to the plavix to get a hold okay from prescribing MD- scheduled OV with pt for Monday 8-13 at 230 pm   [facility]

## 2016-08-15 ENCOUNTER — Encounter: Payer: Self-pay | Admitting: Nurse Practitioner

## 2016-08-15 ENCOUNTER — Telehealth: Payer: Self-pay

## 2016-08-15 ENCOUNTER — Ambulatory Visit (INDEPENDENT_AMBULATORY_CARE_PROVIDER_SITE_OTHER): Payer: BLUE CROSS/BLUE SHIELD | Admitting: Nurse Practitioner

## 2016-08-15 VITALS — BP 128/80 | HR 72 | Ht 72.0 in | Wt 310.0 lb

## 2016-08-15 DIAGNOSIS — Z7901 Long term (current) use of anticoagulants: Secondary | ICD-10-CM | POA: Diagnosis not present

## 2016-08-15 DIAGNOSIS — Z1211 Encounter for screening for malignant neoplasm of colon: Secondary | ICD-10-CM

## 2016-08-15 MED ORDER — NA SULFATE-K SULFATE-MG SULF 17.5-3.13-1.6 GM/177ML PO SOLN: ORAL | 0 refills | Status: DC

## 2016-08-15 NOTE — Progress Notes (Signed)
HPI:  Patient is a 51 year old male referred by PCP Dr. Zada Girt for colon cancer screening. Hitoshi has OSA, HTN and CAD s/p PCI x 2 to LAD Dec 2017. He is scheduled for screening colonoscopy with Dr. Henrene Pastor but needed office evaluation prior to procedure given cardiac hx and need for anti-platelet therapy. He has no bowel changes, blood in stool, abdominal pain, or involuntary weight loss.From a cardiac standpoint he also feels fine. No chest pain, palpitations or SOB.   Past Medical History:  Diagnosis Date  . Anxiety   . Anxiety and depression   . Arthritis   . CAD S/P PCI x 2 to LAD 12/14/2015   Mid LAD lesion, 70 %stenosed. --> (Resolute Onyx 3.0 x15), Dist LAD lesion, 99 %stenosed --> (Resolute Onyx 2.25x15)   . Coronary artery disease   . Depression   . GERD (gastroesophageal reflux disease)   . Headache   . Hypertension   . Progressive angina ([place]) 12/14/2015   Cardiac Cath with m & d LAD lesions - PCI x 2 DES stents     Past Surgical History:  Procedure Laterality Date  . CARDIAC CATHETERIZATION N/A 12/14/2015   Procedure: Left Heart Cath and Coronary Angiography;  Surgeon: [name], MD;  Location: [facility];  Service: Cardiovascular;  Laterality: N/A;  . CARDIAC CATHETERIZATION N/A 12/14/2015   Procedure: Coronary Stent Intervention;  Surgeon: [name], MD;  Location: [facility];  Service: Cardiovascular;  Laterality: N/A;  . CORONARY STENT PLACEMENT  12/14/2015   Mid LAD lesion, 70 %stenosed.  Marland Kitchen SHOULDER SURGERY Left 1998   Family History  Problem Relation Age of Onset  . Heart failure Mother   . Hypertension Mother    Social History  Substance Use Topics  . Smoking status: Never Smoker  . Smokeless tobacco: Never Used  . Alcohol use Yes     Comment: OCC   Current Outpatient Prescriptions  Medication Sig Dispense Refill  . ALPRAZolam (NIRAVAM) 0.25 MG dissolvable tablet Take 0.25 mg by mouth at bedtime as needed for  anxiety.     . ARIPiprazole (ABILIFY) 2 MG tablet Take 2 mg by mouth every evening.     Marland Kitchen aspirin 81 MG tablet Take 81 mg by mouth daily.    Marland Kitchen atorvastatin (LIPITOR) 80 MG tablet Take 1 tablet (80 mg total) by mouth daily at 10 pm. 30 tablet 12  . Cholecalciferol (VITAMIN D) 2000 units tablet Take 2,000 Units by mouth daily.    . clopidogrel (PLAVIX) 75 MG tablet Take 1 tablet (75 mg total) by mouth daily with breakfast. 30 tablet 12  . Fluvoxamine Maleate 150 MG CP24 Take 150 mg by mouth daily.    Marland Kitchen lisinopril (PRINIVIL,ZESTRIL) 40 MG tablet Take 40 mg by mouth daily.    . metoprolol succinate (TOPROL-XL) 50 MG 24 hr tablet Take 50 mg by mouth daily. Take with or immediately following a meal.    . nitroGLYCERIN (NITROSTAT) 0.4 MG SL tablet Place 0.4 mg under the tongue every 5 (five) minutes as needed for chest pain.    Marland Kitchen testosterone cypionate (DEPOTESTOSTERONE CYPIONATE) 200 MG/ML injection Inject 200 mg into the muscle every 14 (fourteen) days.     No current facility-administered medications for this visit.    Allergies  Allergen Reactions  . Bee Venom Other (See Comments)  . Shellfish Allergy Other (See Comments)    Lip numbing.      Review of Systems:  Positive for anxiety, depression, headaches, and night sweats. All other systems reviewed and negative except where noted in HPI.    Physical Exam: BP 128/80   Pulse 72   Ht 6' (1.829 m)   Wt (!) 310 lb (140.6 kg)   BMI 42.04 kg/m  Constitutional:  Pleasant obese white male in no acute distress. Psychiatric: Normal mood and affect. Behavior is normal. EENT: Pupils normal.  Conjunctivae are normal. No scleral icterus. Neck supple.  Cardiovascular: Normal rate, regular rhythm. No edema Pulmonary/chest: Effort normal and breath sounds normal. No wheezing, rales or rhonchi. Abdominal: Soft, nondistended. Nontender. Bowel sounds active throughout. There are no masses palpable. No hepatomegaly. Lymphadenopathy: No cervical  adenopathy noted. Neurological: Alert and oriented to person place and time. Skin: Skin is warm and dry. No rashes noted.   ASSESSMENT AND PLAN:  1. 51 yo male here for routine colon cancer screening.  No GI complaints.  -Patient is already scheduled for a screening colonoscopy with possible polypectomy to be done bu Dr. Henrene Pastor..  The risks and benefits of the procedure were discussed and the patient agrees to proceed. Has cardiac stent place in December. He may not be able interrupt therapy this soon.   2 CAD/ hx stenting. On Plavix.  -Hold Plaxix 5 days before procedure - will instruct when and how to resume after procedure. Patient understands that there is a low but real risk of cardiovascular events such as heart attack, stroke, embolism, or thrombosis while off Plavix  The patient consents to proceed. Will communicate by phone or EMR with patient's prescribing provider to confirm that holding plavix is reasonable in this case though barely 6 months out from PCP so may not be able to hold plavix   Tye Savoy, NP  08/15/2016, 2:52 PM  Cc: Levin Erp, MD

## 2016-08-15 NOTE — Patient Instructions (Addendum)
If you are age 51 or older, your body mass index should be between 23-30. Your Body mass index is 42.04 kg/m. If this is out of the aforementioned range listed, please consider follow up with your Primary Care Provider.  If you are age 46 or younger, your body mass index should be between 19-25. Your Body mass index is 42.04 kg/m. If this is out of the aformentioned range listed, please consider follow up with your Primary Care Provider.   You have been scheduled for a colonoscopy. Please follow written instructions given to you at your visit today.  Please pick up your prep supplies at the pharmacy within the next 1-3 days. If you use inhalers (even only as needed), please bring them with you on the day of your procedure. Your physician has requested that you go to www.startemmi.com and enter the access code given to you at your visit today. This web site gives a general overview about your procedure. However, you should still follow specific instructions given to you by our office regarding your preparation for the procedure.  You will be contacted by our office prior to your procedure for directions on holding your Plavix.  If you do not hear from our office 1 week prior to your scheduled procedure, please call [phone] to discuss.   Thank you for choosing me and [facility].   Tye Savoy, NP

## 2016-08-15 NOTE — Telephone Encounter (Signed)
  [facility] [address] [place], [place]  [address] Phone:  [phone]   Fax:  [phone]  08/15/2016   RE:      Martin Guzman DOB:   May 19, 1965 MRN:   [id number]   Dear Dr. Gwenlyn Found,    We have scheduled the above patient for an endoscopic procedure. Our records show that he is on anticoagulation therapy.   Please advise as to how long the patient may come off his therapy of Plavix prior to the colonoscopy procedure, which is scheduled for 09/01/16.  Please fax back/ or route the completed form to [facility], [place] at [phone].   Sincerely,    Thurmon Fair, RMA

## 2016-08-16 NOTE — Telephone Encounter (Signed)
Routing to [employer], [place]

## 2016-08-16 NOTE — Telephone Encounter (Signed)
She had drug-eluting stenting 12/14/15. He cannot interrupt his antiplatelet therapy for a year from that time.

## 2016-08-18 NOTE — Telephone Encounter (Signed)
-----   Message from [name], MD sent at 08/18/2016 11:09 AM EDT -----   ----- Message ----- From: Lowell Guitar, RMA Sent: 08/15/2016   3:16 PM To: [name], MD

## 2016-08-18 NOTE — Progress Notes (Signed)
The patient has had drug-eluting stenting 12/14/15. He cannot interrupt and surgery for 12 months from that date.

## 2016-08-19 NOTE — Telephone Encounter (Signed)
Spoke with patient.  He has been advised per Dr. Gwenlyn Found that he can not have colonoscopy at this time.  He will need to wait until the middle of December due to stenting.  Patient states he will contact our office in December. I placed a reminder in his chart also.  Peter Congo, C-Road

## 2016-08-19 NOTE — Telephone Encounter (Signed)
Please tell him to make an appt to see [place] in December. Cardiology won't let him hold meds for procedure. Stents less than year ago.  thanks

## 2016-08-19 NOTE — Progress Notes (Signed)
Reviewed. Nevin Bloodgood, his must stay on Plavix for one year from time of stent. His routine screening exam should be postponed until after the new year. Please let patient know. Thanks

## 2016-08-25 DIAGNOSIS — G4733 Obstructive sleep apnea (adult) (pediatric): Secondary | ICD-10-CM | POA: Diagnosis not present

## 2016-09-01 ENCOUNTER — Encounter: Payer: BLUE CROSS/BLUE SHIELD | Admitting: Internal Medicine

## 2016-09-25 DIAGNOSIS — G4733 Obstructive sleep apnea (adult) (pediatric): Secondary | ICD-10-CM | POA: Diagnosis not present

## 2016-09-28 DIAGNOSIS — F329 Major depressive disorder, single episode, unspecified: Secondary | ICD-10-CM | POA: Diagnosis not present

## 2016-09-28 DIAGNOSIS — Z23 Encounter for immunization: Secondary | ICD-10-CM | POA: Diagnosis not present

## 2016-09-28 DIAGNOSIS — I2510 Atherosclerotic heart disease of native coronary artery without angina pectoris: Secondary | ICD-10-CM | POA: Diagnosis not present

## 2016-09-28 DIAGNOSIS — I10 Essential (primary) hypertension: Secondary | ICD-10-CM | POA: Diagnosis not present

## 2016-09-28 DIAGNOSIS — G4733 Obstructive sleep apnea (adult) (pediatric): Secondary | ICD-10-CM | POA: Diagnosis not present

## 2016-10-04 DIAGNOSIS — I2510 Atherosclerotic heart disease of native coronary artery without angina pectoris: Secondary | ICD-10-CM | POA: Diagnosis not present

## 2016-10-04 DIAGNOSIS — C44319 Basal cell carcinoma of skin of other parts of face: Secondary | ICD-10-CM | POA: Diagnosis not present

## 2016-10-25 DIAGNOSIS — G4733 Obstructive sleep apnea (adult) (pediatric): Secondary | ICD-10-CM | POA: Diagnosis not present

## 2016-10-27 DIAGNOSIS — I2510 Atherosclerotic heart disease of native coronary artery without angina pectoris: Secondary | ICD-10-CM | POA: Diagnosis not present

## 2016-10-27 DIAGNOSIS — I10 Essential (primary) hypertension: Secondary | ICD-10-CM | POA: Diagnosis not present

## 2016-11-08 DIAGNOSIS — G4733 Obstructive sleep apnea (adult) (pediatric): Secondary | ICD-10-CM | POA: Diagnosis not present

## 2016-11-25 DIAGNOSIS — G4733 Obstructive sleep apnea (adult) (pediatric): Secondary | ICD-10-CM | POA: Diagnosis not present

## 2016-12-09 ENCOUNTER — Telehealth: Payer: Self-pay

## 2016-12-09 NOTE — Telephone Encounter (Signed)
-----   Message from Lowell Guitar, [place] sent at 08/19/2016  4:18 PM EDT ----- Regarding: colonoscopy Patient needs to schedule colonoscopy the middle of December.  Pt was unable to have procedure in August per Dr. Gwenlyn Found due to stenting and could not come off of his anticoagulant.

## 2016-12-09 NOTE — Telephone Encounter (Signed)
Left a voicemail on pts cell phone - requesting patient to call me back so that we may schedule his colonoscopy.  He was unable to have procedure back in August due to stenting.

## 2016-12-16 ENCOUNTER — Telehealth: Payer: Self-pay

## 2016-12-16 NOTE — Telephone Encounter (Signed)
Letter mailed to patient regarding scheduling colonoscopy for December.

## 2016-12-20 DIAGNOSIS — I10 Essential (primary) hypertension: Secondary | ICD-10-CM | POA: Diagnosis not present

## 2016-12-20 DIAGNOSIS — D229 Melanocytic nevi, unspecified: Secondary | ICD-10-CM | POA: Diagnosis not present

## 2016-12-20 DIAGNOSIS — I2510 Atherosclerotic heart disease of native coronary artery without angina pectoris: Secondary | ICD-10-CM | POA: Diagnosis not present

## 2016-12-25 DIAGNOSIS — G4733 Obstructive sleep apnea (adult) (pediatric): Secondary | ICD-10-CM | POA: Diagnosis not present

## 2017-01-04 DIAGNOSIS — M9904 Segmental and somatic dysfunction of sacral region: Secondary | ICD-10-CM | POA: Diagnosis not present

## 2017-01-04 DIAGNOSIS — M545 Low back pain: Secondary | ICD-10-CM | POA: Diagnosis not present

## 2017-01-04 DIAGNOSIS — M9905 Segmental and somatic dysfunction of pelvic region: Secondary | ICD-10-CM | POA: Diagnosis not present

## 2017-01-04 DIAGNOSIS — M9903 Segmental and somatic dysfunction of lumbar region: Secondary | ICD-10-CM | POA: Diagnosis not present

## 2017-01-06 DIAGNOSIS — M9904 Segmental and somatic dysfunction of sacral region: Secondary | ICD-10-CM | POA: Diagnosis not present

## 2017-01-06 DIAGNOSIS — M9903 Segmental and somatic dysfunction of lumbar region: Secondary | ICD-10-CM | POA: Diagnosis not present

## 2017-01-06 DIAGNOSIS — M9905 Segmental and somatic dysfunction of pelvic region: Secondary | ICD-10-CM | POA: Diagnosis not present

## 2017-01-06 DIAGNOSIS — M545 Low back pain: Secondary | ICD-10-CM | POA: Diagnosis not present

## 2017-01-09 DIAGNOSIS — M9904 Segmental and somatic dysfunction of sacral region: Secondary | ICD-10-CM | POA: Diagnosis not present

## 2017-01-09 DIAGNOSIS — M9903 Segmental and somatic dysfunction of lumbar region: Secondary | ICD-10-CM | POA: Diagnosis not present

## 2017-01-09 DIAGNOSIS — M545 Low back pain: Secondary | ICD-10-CM | POA: Diagnosis not present

## 2017-01-09 DIAGNOSIS — M9905 Segmental and somatic dysfunction of pelvic region: Secondary | ICD-10-CM | POA: Diagnosis not present

## 2017-01-12 DIAGNOSIS — M9903 Segmental and somatic dysfunction of lumbar region: Secondary | ICD-10-CM | POA: Diagnosis not present

## 2017-01-12 DIAGNOSIS — M9904 Segmental and somatic dysfunction of sacral region: Secondary | ICD-10-CM | POA: Diagnosis not present

## 2017-01-12 DIAGNOSIS — M9905 Segmental and somatic dysfunction of pelvic region: Secondary | ICD-10-CM | POA: Diagnosis not present

## 2017-01-12 DIAGNOSIS — M545 Low back pain: Secondary | ICD-10-CM | POA: Diagnosis not present

## 2017-01-18 DIAGNOSIS — M9905 Segmental and somatic dysfunction of pelvic region: Secondary | ICD-10-CM | POA: Diagnosis not present

## 2017-01-18 DIAGNOSIS — M9904 Segmental and somatic dysfunction of sacral region: Secondary | ICD-10-CM | POA: Diagnosis not present

## 2017-01-18 DIAGNOSIS — M545 Low back pain: Secondary | ICD-10-CM | POA: Diagnosis not present

## 2017-01-18 DIAGNOSIS — M9903 Segmental and somatic dysfunction of lumbar region: Secondary | ICD-10-CM | POA: Diagnosis not present

## 2017-01-25 DIAGNOSIS — G4733 Obstructive sleep apnea (adult) (pediatric): Secondary | ICD-10-CM | POA: Diagnosis not present

## 2017-01-30 DIAGNOSIS — M9905 Segmental and somatic dysfunction of pelvic region: Secondary | ICD-10-CM | POA: Diagnosis not present

## 2017-01-30 DIAGNOSIS — M9904 Segmental and somatic dysfunction of sacral region: Secondary | ICD-10-CM | POA: Diagnosis not present

## 2017-01-30 DIAGNOSIS — M9903 Segmental and somatic dysfunction of lumbar region: Secondary | ICD-10-CM | POA: Diagnosis not present

## 2017-01-30 DIAGNOSIS — M545 Low back pain: Secondary | ICD-10-CM | POA: Diagnosis not present

## 2017-02-25 DIAGNOSIS — G4733 Obstructive sleep apnea (adult) (pediatric): Secondary | ICD-10-CM | POA: Diagnosis not present

## 2017-04-05 DIAGNOSIS — F411 Generalized anxiety disorder: Secondary | ICD-10-CM | POA: Diagnosis not present

## 2017-04-05 DIAGNOSIS — F330 Major depressive disorder, recurrent, mild: Secondary | ICD-10-CM | POA: Diagnosis not present

## 2017-04-07 ENCOUNTER — Ambulatory Visit (INDEPENDENT_AMBULATORY_CARE_PROVIDER_SITE_OTHER): Payer: BLUE CROSS/BLUE SHIELD | Admitting: Physician Assistant

## 2017-04-07 ENCOUNTER — Telehealth: Payer: Self-pay

## 2017-04-07 ENCOUNTER — Encounter: Payer: Self-pay | Admitting: Physician Assistant

## 2017-04-07 ENCOUNTER — Encounter (INDEPENDENT_AMBULATORY_CARE_PROVIDER_SITE_OTHER): Payer: Self-pay

## 2017-04-07 VITALS — BP 126/70 | HR 74 | Ht 69.0 in | Wt 313.5 lb

## 2017-04-07 DIAGNOSIS — Z5181 Encounter for therapeutic drug level monitoring: Secondary | ICD-10-CM | POA: Diagnosis not present

## 2017-04-07 DIAGNOSIS — Z1211 Encounter for screening for malignant neoplasm of colon: Secondary | ICD-10-CM | POA: Diagnosis not present

## 2017-04-07 DIAGNOSIS — Z7901 Long term (current) use of anticoagulants: Secondary | ICD-10-CM | POA: Diagnosis not present

## 2017-04-07 NOTE — Progress Notes (Signed)
Agree with assessment and plan. Cardiology to determine if short-term interruption of Plavix reasonable

## 2017-04-07 NOTE — Patient Instructions (Addendum)
If you are age 52 or older, your body mass index should be between 23-30. Your Body mass index is 46.3 kg/m. If this is out of the aforementioned range listed, please consider follow up with your Primary Care Provider.  If you are age 47 or younger, your body mass index should be between 19-25. Your Body mass index is 46.3 kg/m. If this is out of the aformentioned range listed, please consider follow up with your Primary Care Provider.   You have been scheduled for a colonoscopy. Please follow written instructions given to you at your visit today.  Please pick up your prep supplies at the pharmacy within the next 1-3 days. If you use inhalers (even only as needed), please bring them with you on the day of your procedure. Your physician has requested that you go to www.startemmi.com and enter the access code given to you at your visit today. This web site gives a general overview about your procedure. However, you should still follow specific instructions given to you by our office regarding your preparation for the procedure.  You will be contacted by our office prior to your procedure for directions on holding your Plavix.  If you do not hear from our office 1 week prior to your scheduled procedure, please call [phone] to discuss.  Stay on your Baby Aspirin.   Thank you for choosing [facility], Nicoletta Ba, PA-C

## 2017-04-07 NOTE — Telephone Encounter (Signed)
[facility] Pre-operative Risk Assessment     Request for surgical clearance:     Endoscopy Procedure  What type of surgery is being performed?     Colonoscopy  When is this surgery scheduled?     05/23/17 4pm What type of clearance is required ?  / Pharmacy  Are there any medications that need to be held prior to surgery and how long? Plavix, 5 days  Practice name and name of physician performing surgery?      [facility]  What is your office phone and fax number?      Phone- [phone]  [phone]  Anesthesia type (None, local, MAC, general) ?       MAC

## 2017-04-07 NOTE — Progress Notes (Signed)
Subjective:    Patient ID: Martin Guzman, male    DOB: [birth year], 52 y.o.   MRN: [id number]  HPI Martin Guzman is a pleasant 52 year old white male, established with Dr. Henrene Guzman who comes in to discuss colonoscopy. Patient was initially seen here in August 2018 for screening colonoscopy.  He has coronary artery disease and had undergone PCI and stent x2 of the LAD in December 2017 and was on Plavix and aspirin. Recommendation was to delay colonoscopy until he was at least a year out from stent placement. Other medical problems include hypertension, sleep apnea, anxiety and morbid obesity. He has no current complaints today.  Specifically no complaints of abdominal discomfort changes in bowel habits melena or hematochezia.  No upper GI symptoms. Family history is negative for colon cancer and polyps. He says he has not had any cardiac issues since his stents were placed.  Review of Systems.Pertinent positive and negative review of systems were noted in the above HPI section.  All other review of systems was otherwise negative.  Outpatient Encounter Medications as of 04/07/2017  Medication Sig  . ALPRAZolam (NIRAVAM) 0.25 MG dissolvable tablet Take 0.25 mg by mouth at bedtime as needed for anxiety.   . ARIPiprazole (ABILIFY) 2 MG tablet Take 2 mg by mouth every evening.   Marland Kitchen aspirin 81 MG tablet Take 81 mg by mouth daily.  Marland Kitchen atorvastatin (LIPITOR) 80 MG tablet Take 1 tablet (80 mg total) by mouth daily at 10 pm.  . Cholecalciferol (VITAMIN D) 2000 units tablet Take 2,000 Units by mouth daily.  . clopidogrel (PLAVIX) 75 MG tablet Take 1 tablet (75 mg total) by mouth daily with breakfast.  . Fluvoxamine Maleate 150 MG CP24 Take 150 mg by mouth daily.  Marland Kitchen lisinopril (PRINIVIL,ZESTRIL) 40 MG tablet Take 40 mg by mouth daily.  . metoprolol succinate (TOPROL-XL) 50 MG 24 hr tablet Take 50 mg by mouth daily. Take with or immediately following a meal.  . Na Sulfate-K Sulfate-Mg Sulf 17.5-3.13-1.6 GM/180ML  SOLN Suprep-Use as directed  . nitroGLYCERIN (NITROSTAT) 0.4 MG SL tablet Place 0.4 mg under the tongue every 5 (five) minutes as needed for chest pain.  Marland Kitchen testosterone cypionate (DEPOTESTOSTERONE CYPIONATE) 200 MG/ML injection Inject 200 mg into the muscle every 14 (fourteen) days.   No facility-administered encounter medications on file as of 04/07/2017.    Allergies  Allergen Reactions  . Bee Venom Other (See Comments)  . Shellfish Allergy Other (See Comments)    Lip numbing.    Patient Active Problem List   Diagnosis Date Noted  . Progressive angina ([place]) 12/15/2015  . Ischemic chest pain 12/14/2015  . CAD S/P PCI x 2 to LAD 12/14/2015  . Chest pain 12/08/2015  . GAD (generalized anxiety disorder) 04/22/2014  . 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 Edema 12/03/2013  . Back pain 06/20/2013  . Elevated blood pressure reading 12/11/2012  . Hyperlipidemia with target low density lipoprotein (LDL) cholesterol less than 70 mg/dL 12/11/2012  . Testicular hypofunction 12/11/2012  . Arthropathy 10/29/2012  . Heartburn 10/29/2012  . Migraine 10/29/2012  . HYPERGLYCEMIA 05/04/2007  . OSA (obstructive sleep apnea) 07/24/2006  . Essential hypertension 07/20/2006   Social History   Socioeconomic History  . Marital status: Married    Spouse name: Not on file  . Number of children: 1  . Years of education: Not on file  . Highest education level: Not on file  Occupational History  . Not on file  Social  Needs  . Financial resource strain: Not on file  . Food insecurity:    Worry: Not on file    Inability: Not on file  . Transportation needs:    Medical: Not on file    Non-medical: Not on file  Tobacco Use  . Smoking status: Never Smoker  . Smokeless tobacco: Never Used  Substance and Sexual Activity  . Alcohol use: Yes    Comment: OCC  . Drug use: No  . Sexual activity: Not on file  Lifestyle  . Physical activity:    Days per week: Not on  file    Minutes per session: Not on file  . Stress: Not on file  Relationships  . Social connections:    Talks on phone: Not on file    Gets together: Not on file    Attends religious service: Not on file    Active member of club or organization: Not on file    Attends meetings of clubs or organizations: Not on file    Relationship status: Not on file  . Intimate partner violence:    Fear of current or ex partner: Not on file    Emotionally abused: Not on file    Physically abused: Not on file    Forced sexual activity: Not on file  Other Topics Concern  . Not on file  Social History Narrative  . Not on file    [name]'s family history includes Heart failure in his mother; Hypertension in his mother.      Objective:    Vitals:   04/07/17 1454  BP: 126/70  Pulse: 74    Physical Exam; well-developed white male in no acute distress, blood pressure 126/70 pulse 74, height 5 foot 9, weight 313, BMI 46.3.  HEENT ;nontraumatic normocephalic EOMI PERRLA sclera anicteric, Cardiovascular ;regular rate and rhythm with S1-S2 no murmur rub or gallop, Pulmonary; clear bilaterally, Abdomen; morbidly obese, soft, nontender nondistended bowel sounds are active no palpable mass or hepatosplenomegaly, Rectal ;exam not done, Extremities ;no clubbing cyanosis or edema, Neuro psych; mood and affect appropriate       Assessment & Plan:   #47 52 year old white male seen for colon cancer screening, average risk for colon cancer and asymptomatic #2 coronary artery disease status post stent x2 LAD December 2017 #3 chronic antiplatelet therapy-on Plavix and aspirin #4 sleep apnea-no oxygen use #5 morbid obesity BMI 46 #6 hypertension #7 anxiety  Plan; Patient will be scheduled for colonoscopy with Dr. Henrene Guzman.  Procedure was discussed in detail with the patient including indications risks and benefits and he is agreeable to proceed. We will need to hold Plavix for 5 days prior to the  colonoscopy, he will continue baby aspirin.   We will communicate with Dr. Quay Guzman his cardiologist to assure that holding Plavix is reasonable for this patient.     Martin Guzman S Kari Montero PA-C 04/07/2017   Cc: Levin Erp, MD

## 2017-04-10 NOTE — Telephone Encounter (Signed)
   Primary Cardiologist: Quay Burow, MD  Chart reviewed as part of pre-operative protocol coverage. Patient was contacted 04/10/2017 in reference to pre-operative risk assessment for pending surgery as outlined below.  Zekiel Torian Scollard was last seen on 06/21/2016  by Dr. Gwenlyn Found. Since that day, KANNEN MOXEY has done well from a cardiac standpoint.   Therefore, based on ACC/AHA guidelines, the patient would be at acceptable risk for the planned procedure without further cardiovascular testing. He is ok to stop his Plavix for 5 days prior to procedure.   I will route this recommendation to the requesting party via Epic fax function and remove from pre-op pool.  Please call with questions.  Jory Sims DNP, ANP, AACC  04/10/2017, 4:56 PM

## 2017-04-11 NOTE — Telephone Encounter (Signed)
Forwarded to requesting providers office via EPIC

## 2017-04-12 ENCOUNTER — Telehealth: Payer: Self-pay | Admitting: *Deleted

## 2017-04-12 NOTE — Telephone Encounter (Signed)
Spoke to patient on 04-12-2017. Advised him per Dr. Gwenlyn Found, He is to stop his Plavix on 05-18-17 and stay off of it until after the colonoscopy on 05-23-2017. Dr. Scarlette Shorts will advise him when to resume the Plavix.

## 2017-04-14 ENCOUNTER — Telehealth: Payer: Self-pay

## 2017-04-19 NOTE — Telephone Encounter (Signed)
error 

## 2017-05-01 DIAGNOSIS — J40 Bronchitis, not specified as acute or chronic: Secondary | ICD-10-CM | POA: Diagnosis not present

## 2017-05-12 DIAGNOSIS — F330 Major depressive disorder, recurrent, mild: Secondary | ICD-10-CM | POA: Diagnosis not present

## 2017-05-12 DIAGNOSIS — F411 Generalized anxiety disorder: Secondary | ICD-10-CM | POA: Diagnosis not present

## 2017-05-22 ENCOUNTER — Encounter (HOSPITAL_COMMUNITY): Payer: Self-pay | Admitting: *Deleted

## 2017-05-22 ENCOUNTER — Emergency Department (HOSPITAL_COMMUNITY)
Admission: EM | Admit: 2017-05-22 | Discharge: 2017-05-22 | Disposition: A | Discharge status: Home / Self Care | Payer: BLUE CROSS/BLUE SHIELD | Attending: Emergency Medicine | Admitting: Emergency Medicine

## 2017-05-22 ENCOUNTER — Ambulatory Visit (HOSPITAL_COMMUNITY): Admission: EM | Admit: 2017-05-22 | Discharge: 2017-05-22 | Payer: BLUE CROSS/BLUE SHIELD

## 2017-05-22 ENCOUNTER — Other Ambulatory Visit: Payer: Self-pay

## 2017-05-22 DIAGNOSIS — I10 Essential (primary) hypertension: Secondary | ICD-10-CM | POA: Insufficient documentation

## 2017-05-22 DIAGNOSIS — W268XXA Contact with other sharp object(s), not elsewhere classified, initial encounter: Secondary | ICD-10-CM | POA: Insufficient documentation

## 2017-05-22 DIAGNOSIS — I2510 Atherosclerotic heart disease of native coronary artery without angina pectoris: Secondary | ICD-10-CM | POA: Diagnosis not present

## 2017-05-22 DIAGNOSIS — Z79899 Other long term (current) drug therapy: Secondary | ICD-10-CM | POA: Diagnosis not present

## 2017-05-22 DIAGNOSIS — Z7902 Long term (current) use of antithrombotics/antiplatelets: Secondary | ICD-10-CM | POA: Diagnosis not present

## 2017-05-22 DIAGNOSIS — E785 Hyperlipidemia, unspecified: Secondary | ICD-10-CM | POA: Diagnosis not present

## 2017-05-22 DIAGNOSIS — Y9289 Other specified places as the place of occurrence of the external cause: Secondary | ICD-10-CM | POA: Insufficient documentation

## 2017-05-22 DIAGNOSIS — Z7982 Long term (current) use of aspirin: Secondary | ICD-10-CM | POA: Diagnosis not present

## 2017-05-22 DIAGNOSIS — S81811A Laceration without foreign body, right lower leg, initial encounter: Secondary | ICD-10-CM | POA: Diagnosis not present

## 2017-05-22 DIAGNOSIS — Y939 Activity, unspecified: Secondary | ICD-10-CM | POA: Diagnosis not present

## 2017-05-22 DIAGNOSIS — S81812A Laceration without foreign body, left lower leg, initial encounter: Secondary | ICD-10-CM | POA: Insufficient documentation

## 2017-05-22 DIAGNOSIS — Y990 Civilian activity done for income or pay: Secondary | ICD-10-CM | POA: Insufficient documentation

## 2017-05-22 MED ORDER — LIDOCAINE-EPINEPHRINE (PF) 2 %-1:200000 IJ SOLN
20.0000 mL | Freq: Once | INTRAMUSCULAR | Status: AC
  Administered 2017-05-22: 20 mL
  Filled 2017-05-22: qty 20

## 2017-05-22 MED ORDER — BACITRACIN ZINC 500 UNIT/GM EX OINT
TOPICAL_OINTMENT | Freq: Once | CUTANEOUS | Status: AC
  Administered 2017-05-22: 19:00:00 via TOPICAL
  Filled 2017-05-22: qty 0.9

## 2017-05-22 NOTE — ED Triage Notes (Signed)
Pt hit right leg open with a piece of metal at work and has about 2 inches in length and currently has a clotted appearance and bleeding controlled with pressure dressiing.  Last tetanus shot today. Has been off plavix about 5-6 days

## 2017-05-22 NOTE — ED Provider Notes (Signed)
[facility] EMERGENCY DEPARTMENT Provider Note   CSN: [id number] Arrival date & time: 05/22/17  1556     History   Chief Complaint Chief Complaint  Patient presents with  . Leg Injury    HPI Martin Guzman is a 52 y.o. male.  The history is provided by the patient and medical records. No language interpreter was used.   Martin Guzman is a 52 y.o. male presents emergency department for evaluation of right leg laceration which occurred earlier today while at work.  He hit his leg on a piece of metal that was projecting outward.  [facility] area was [facility] and he was provided a tetanus shot.  He was told to come to the emergency department for repair of his laceration.  No numbness, tingling or weakness.  He is scheduled for colonoscopy tomorrow, therefore has been off of his anticoagulation for the last several days.  Past Medical History:  Diagnosis Date  . Anxiety   . Anxiety and depression   . Arthritis   . CAD S/P PCI x 2 to LAD 12/14/2015   Mid LAD lesion, 70 %stenosed. --> ([facility] 3.0 x15), Dist LAD lesion, 99 %stenosed --> ([facility] [place] 2.25x15)   . Coronary artery disease   . Depression   . GERD (gastroesophageal reflux disease)   . Headache   . Hypertension   . Progressive angina ([place]) 12/14/2015   Cardiac Cath with m & d LAD lesions - PCI x 2 DES stents    Patient Active Problem List   Diagnosis Date Noted  . Progressive angina ([place]) 12/15/2015  . Ischemic chest pain 12/14/2015  . CAD S/P PCI x 2 to LAD 12/14/2015  . Chest pain 12/08/2015  . GAD (generalized anxiety disorder) 04/22/2014  . Major depressive disorder, recurrent episode, mild ([place]) 04/22/2014  . Edema 12/03/2013  . Back pain 06/20/2013  . Elevated blood pressure reading 12/11/2012  . Hyperlipidemia with target low density lipoprotein (LDL) cholesterol less than 70 mg/dL 12/11/2012  . Testicular hypofunction 12/11/2012    . Arthropathy 10/29/2012  . Heartburn 10/29/2012  . Migraine 10/29/2012  . HYPERGLYCEMIA 05/04/2007  . OSA (obstructive sleep apnea) 07/24/2006  . Essential hypertension 07/20/2006    Past Surgical History:  Procedure Laterality Date  . CARDIAC CATHETERIZATION N/A 12/14/2015   Procedure: Left Heart Cath and Coronary Angiography;  Surgeon: [name], MD;  Location: [facility];  Service: Cardiovascular;  Laterality: N/A;  . CARDIAC CATHETERIZATION N/A 12/14/2015   Procedure: Coronary Stent Intervention;  Surgeon: [name], MD;  Location: [facility];  Service: Cardiovascular;  Laterality: N/A;  . CORONARY STENT PLACEMENT  12/14/2015   Mid LAD lesion, 70 %stenosed.  Marland Kitchen SHOULDER SURGERY Left 1998        Home Medications    Prior to Admission medications   Medication Sig Start Date End Date Taking? Authorizing Provider  ALPRAZolam (NIRAVAM) 0.25 MG dissolvable tablet Take 0.25 mg by mouth at bedtime as needed for anxiety.  05/26/14   [provider]  ARIPiprazole (ABILIFY) 2 MG tablet Take 2 mg by mouth every evening.  05/26/14   [provider]  aspirin 81 MG tablet Take 81 mg by mouth daily.    [provider]  atorvastatin (LIPITOR) 80 MG tablet Take 1 tablet (80 mg total) by mouth daily at 10 pm. 12/15/15   Arbutus Leas, NP  Cholecalciferol (VITAMIN D)  2000 units tablet Take 2,000 Units by mouth daily.    [provider]  clopidogrel (PLAVIX) 75 MG tablet Take 1 tablet (75 mg total) by mouth daily with breakfast. 12/16/15   Arbutus Leas, NP  Fluvoxamine Maleate 150 MG CP24 Take 150 mg by mouth daily. 12/03/13   [provider]  lisinopril (PRINIVIL,ZESTRIL) 40 MG tablet Take 40 mg by mouth daily.    [provider]  metoprolol succinate (TOPROL-XL) 50 MG 24 hr tablet Take 50 mg by mouth daily. Take with or immediately following a meal.    [provider]  Na Sulfate-K Sulfate-Mg Sulf  17.5-3.13-1.6 GM/180ML SOLN Suprep-Use as directed 08/15/16   Willia Craze, NP  nitroGLYCERIN (NITROSTAT) 0.4 MG SL tablet Place 0.4 mg under the tongue every 5 (five) minutes as needed for chest pain.    [provider]  testosterone cypionate (DEPOTESTOSTERONE CYPIONATE) 200 MG/ML injection Inject 200 mg into the muscle every 14 (fourteen) days. 06/20/13   [provider]    Family History Family History  Problem Relation Age of Onset  . Heart failure Mother   . Hypertension Mother     Social History Social History   Tobacco Use  . Smoking status: Never Smoker  . Smokeless tobacco: Never Used  Substance Use Topics  . Alcohol use: Yes    Comment: OCC  . Drug use: No     Allergies   Bee venom and Shellfish allergy   Review of Systems Review of Systems  Skin: Positive for wound.  Neurological: Negative for weakness and numbness.     Physical Exam Updated Vital Signs BP (!) 153/86 (BP Location: Right Arm)   Pulse 67   Temp 98.3 F (36.8 C) (Oral)   Resp 18   Ht 6' (1.829 m)   Wt (!) 140.2 kg (309 lb)   SpO2 97%   BMI 41.91 kg/m   Physical Exam  Constitutional: He appears well-developed and well-nourished. No distress.  HENT:  Head: Normocephalic and atraumatic.  Neck: Neck supple.  Cardiovascular: Normal rate, regular rhythm and normal heart sounds.  No murmur heard. Pulmonary/Chest: Effort normal and breath sounds normal. No respiratory distress. He has no wheezes. He has no rales.  Musculoskeletal:  Full range of motion and 5/5 strength of the right lower extremity.  No tenderness to palpation of the wounded area.  Neurological: He is alert.  Skin: Skin is warm and dry.  4.5 cm laceration to right anterior lower extremity near mid-shin  Nursing note and vitals reviewed.    ED Treatments / Results  Labs (all labs ordered are listed, but only abnormal results are displayed) Labs Reviewed - No data to  display  EKG None  Radiology No results found.  Procedures .Marland KitchenLaceration Repair Date/Time: 05/22/2017 7:33 PM Performed by: Rashawnda Gaba, Ozella Almond, PA-C Authorized by: Leyda Vanderwerf, Ozella Almond, PA-C   Consent:    Consent obtained:  Verbal   Consent given by:  Patient   Risks discussed:  Pain, infection, poor cosmetic result and poor wound healing Anesthesia (see MAR for exact dosages):    Anesthesia method:  Local infiltration   Local anesthetic:  Lidocaine 2% WITH epi Laceration details:    Location:  Leg   Leg location:  R lower leg   Length (cm):  4.5 Repair type:    Repair type:  Simple Pre-procedure details:    Preparation:  Patient was prepped and draped in usual sterile fashion Exploration:    Hemostasis achieved with:  Direct pressure and epinephrine   Wound exploration: wound explored through full range of motion and entire depth of wound probed and visualized   Treatment:    Area cleansed with:  Saline   Amount of cleaning:  Standard   Irrigation solution:  Sterile saline Skin repair:    Repair method:  Sutures   Suture size:  5-0   Suture material:  Nylon   Suture technique:  Simple interrupted   Number of sutures:  8 Approximation:    Approximation:  Close Post-procedure details:    Dressing:  Antibiotic ointment   Patient tolerance of procedure:  Tolerated well, no immediate complications   (including critical care time)  Medications Ordered in ED Medications  lidocaine-EPINEPHrine (XYLOCAINE W/EPI) 2 %-1:200000 (PF) injection 20 mL (20 mLs Infiltration Given 05/22/17 1833)  bacitracin ointment ( Topical Given 05/22/17 1901)     Initial Impression / Assessment and Plan / ED Course  I have reviewed the triage vital signs and the nursing notes.  Pertinent labs & imaging results that were available during my care of the patient were reviewed by me and considered in my medical decision making (see chart for details).    Martin Guzman is a 52 y.o. male who  presents to ED for laceration of RLE. Wound thoroughly cleaned in ED today. Wound explored and bottom of wound seen in a bloodless field. No visible foreign body appreciated. Given that he hit the area of a piece of metal, I recommended x-ray to assess for smaller pieces of metal which could be in the wound. Patient stated that he did not want x-ray and did not feel that was needed. He denies any chance there could be FB. Declined imaging. Laceration repaired as dictated above. Patient counseled on home wound care. Follow up with PCP/urgent care or return to ER for suture removal in 10 days. Patient was urged to return to the Emergency Department for worsening pain, swelling, expanding erythema especially if it streaks away from the affected area, fever, or for any additional concerns. Patient verbalized understanding. All questions answered.    Final Clinical Impressions(s) / ED Diagnoses   Final diagnoses:  Laceration of right lower extremity, initial encounter    ED Discharge Orders    None       Raffaella Edison, Ozella Almond, PA-C 05/22/17 Lesia Sago, MD 05/25/17 1008

## 2017-05-22 NOTE — Discharge Instructions (Signed)
It was my pleasure taking care of you today!   Keep wound clean with mild soap and water. Keep area covered with a topical antibiotic ointment and bandage, keep bandage dry, and do not submerge in water for 24 hours. Ice and elevate for additional pain relief and swelling. Alternate between ibuprofen and Tylenol for additional pain relief. Follow up with your primary care doctor, [facility] or ER in approximately 10 days for wound recheck and suture removal. Monitor area for signs of infection to include, but not limited to: increasing pain, spreading redness, drainage/pus, worsening swelling, or fevers. Return to emergency department for emergent changing or worsening symptoms.   WOUND CARE Keep area clean and dry for 24 hours. Do not remove bandage, if applied. After 24 hours,you should change it at least once a day. Also, change the dressing if it becomes wet or dirty, or as directed by your caregiver.  Wash the wound with soap and water 2 times a day. Rinse the wound off with water to remove all soap. Pat the wound dry with a clean towel.  You may shower as usual after the first 24 hours. Do not soak the wound in water until the sutures are removed.  Return if you experience any of the following signs of infection: Swelling, redness, pus drainage, streaking, fever >101.0 F Return if you experience excessive bleeding that does not stop after 15-20 minutes of constant, firm pressure.

## 2017-05-23 ENCOUNTER — Encounter: Payer: Self-pay | Admitting: Internal Medicine

## 2017-05-23 ENCOUNTER — Ambulatory Visit (AMBULATORY_SURGERY_CENTER): Payer: BLUE CROSS/BLUE SHIELD | Admitting: Internal Medicine

## 2017-05-23 ENCOUNTER — Other Ambulatory Visit: Payer: Self-pay

## 2017-05-23 VITALS — BP 108/63 | HR 66 | Temp 98.9°F | Resp 12 | Ht 69.0 in | Wt 313.0 lb

## 2017-05-23 DIAGNOSIS — K635 Polyp of colon: Secondary | ICD-10-CM

## 2017-05-23 DIAGNOSIS — D125 Benign neoplasm of sigmoid colon: Secondary | ICD-10-CM | POA: Diagnosis not present

## 2017-05-23 DIAGNOSIS — Z1211 Encounter for screening for malignant neoplasm of colon: Secondary | ICD-10-CM | POA: Diagnosis not present

## 2017-05-23 DIAGNOSIS — D122 Benign neoplasm of ascending colon: Secondary | ICD-10-CM

## 2017-05-23 MED ORDER — SODIUM CHLORIDE 0.9 % IV SOLN: 500.0000 mL | Freq: Once | INTRAVENOUS | Status: DC

## 2017-05-23 NOTE — Progress Notes (Signed)
Report to PACU, RN, vss, BBS= Clear.  

## 2017-05-23 NOTE — Progress Notes (Signed)
Called to room to assist during endoscopic procedure.  Patient ID and intended procedure confirmed with present staff. Received instructions for my participation in the procedure from the performing physician.  

## 2017-05-23 NOTE — Op Note (Signed)
[facility] Patient Name: Martin Guzman Procedure Date: 05/23/2017 3:15 PM MRN: [id number] Endoscopist: Docia Chuck. Henrene Pastor , MD Age: 52 Referring MD:  Date of Birth: 1965-03-16 Gender: Male Account #: [id number] Procedure:                Colonoscopy, with cold snare polypectomy x 4 Indications:              Screening for colorectal malignant neoplasm Medicines:                Monitored Anesthesia Care Procedure:                Pre-Anesthesia Assessment:                           - Prior to the procedure, a History and Physical                            was performed, and patient medications and                            allergies were reviewed. The patient's tolerance of                            previous anesthesia was also reviewed. The risks                            and benefits of the procedure and the sedation                            options and risks were discussed with the patient.                            All questions were answered, and informed consent                            was obtained. Prior Anticoagulants: The patient has                            taken Plavix (clopidogrel), last dose was 7 days                            prior to procedure. ASA Grade Assessment: III - A                            patient with severe systemic disease. After                            reviewing the risks and benefits, the patient was                            deemed in satisfactory condition to undergo the                            procedure.  After obtaining informed consent, the colonoscope                            was passed under direct vision. Throughout the                            procedure, the patient's blood pressure, pulse, and                            oxygen saturations were monitored continuously. The                            Colonoscope was introduced through the anus and                            advanced to the the  cecum, identified by                            appendiceal orifice and ileocecal valve. The                            ileocecal valve, appendiceal orifice, and rectum                            were photographed. The quality of the bowel                            preparation was excellent. The colonoscopy was                            performed without difficulty. The patient tolerated                            the procedure well. The bowel preparation used was                            SUPREP. Scope In: 3:22:45 PM Scope Out: 3:41:10 PM Scope Withdrawal Time: 0 hours 11 minutes 59 seconds  Total Procedure Duration: 0 hours 18 minutes 25 seconds  Findings:                 Four polyps were found in the sigmoid colon and                            ascending colon. The polyps were 5 to 9 mm in size.                            These polyps were removed with a cold snare.                            Resection and retrieval were complete.                           Internal hemorrhoids were found during retroflexion.  The exam was otherwise without abnormality on                            direct and retroflexion views. Complications:            No immediate complications. Estimated blood loss:                            None. Estimated Blood Loss:     Estimated blood loss: none. Impression:               - Four 5 to 9 mm polyps in the sigmoid colon and in                            the ascending colon, removed with a cold snare.                            Resected and retrieved.                           - Internal hemorrhoids.                           - The examination was otherwise normal on direct                            and retroflexion views. Recommendation:           - Repeat colonoscopy in 3 or 5 years for                            surveillance, based on pathology results.                           - Resume Plavix (clopidogrel) today at prior dose.                            - Patient has a contact number available for                            emergencies. The signs and symptoms of potential                            delayed complications were discussed with the                            patient. Return to normal activities tomorrow.                            Written discharge instructions were provided to the                            patient.                           - Resume previous diet.                           -  Continue present medications.                           - Await pathology results. Docia Chuck. Henrene Pastor, MD 05/23/2017 3:45:41 PM This report has been signed electronically.

## 2017-05-23 NOTE — Patient Instructions (Signed)
Please read handouts provided to you today. Resume Plavix today at prior dose.   YOU HAD AN ENDOSCOPIC PROCEDURE TODAY AT [facility]:   Refer to the procedure report that was given to you for any specific questions about what was found during the examination.  If the procedure report does not answer your questions, please call your gastroenterologist to clarify.  If you requested that your care partner not be given the details of your procedure findings, then the procedure report has been included in a sealed envelope for you to review at your convenience later.  YOU SHOULD EXPECT: Some feelings of bloating in the abdomen. Passage of more gas than usual.  Walking can help get rid of the air that was put into your GI tract during the procedure and reduce the bloating. If you had a lower endoscopy (such as a colonoscopy or flexible sigmoidoscopy) you may notice spotting of blood in your stool or on the toilet paper. If you underwent a bowel prep for your procedure, you may not have a normal bowel movement for a few days.  Please Note:  You might notice some irritation and congestion in your nose or some drainage.  This is from the oxygen used during your procedure.  There is no need for concern and it should clear up in a day or so.  SYMPTOMS TO REPORT IMMEDIATELY:   Following lower endoscopy (colonoscopy or flexible sigmoidoscopy):  Excessive amounts of blood in the stool  Significant tenderness or worsening of abdominal pains  Swelling of the abdomen that is new, acute  Fever of 100F or higher   For urgent or emergent issues, a gastroenterologist can be reached at any hour by calling [phone].   DIET:  We do recommend a small meal at first, but then you may proceed to your regular diet.  Drink plenty of fluids but you should avoid alcoholic beverages for 24 hours.  ACTIVITY:  You should plan to take it easy for the rest of today and you should NOT DRIVE or use heavy  machinery until tomorrow (because of the sedation medicines used during the test).    FOLLOW UP: Our staff will call the number listed on your records the next business day following your procedure to check on you and address any questions or concerns that you may have regarding the information given to you following your procedure. If we do not reach you, we will leave a message.  However, if you are feeling well and you are not experiencing any problems, there is no need to return our call.  We will assume that you have returned to your regular daily activities without incident.  If any biopsies were taken you will be contacted by phone or by letter within the next 1-3 weeks.  Please call us at [phone] if you have not heard about the biopsies in 3 weeks.    SIGNATURES/CONFIDENTIALITY: You and/or your care partner have signed paperwork which will be entered into your electronic medical record.  These signatures attest to the fact that that the information above on your After Visit Summary has been reviewed and is understood.  Full responsibility of the confidentiality of this discharge information lies with you and/or your care-partner.

## 2017-05-24 ENCOUNTER — Telehealth: Payer: Self-pay | Admitting: *Deleted

## 2017-05-24 NOTE — Telephone Encounter (Signed)
  Follow up Call-  Call back number 05/23/2017  Post procedure Call Back phone  # [phone]  Permission to leave phone message Yes  Some recent data might be hidden     Patient questions:  Do you have a fever, pain , or abdominal swelling? No. Pain Score  0 *  Have you tolerated food without any problems? Yes.    Have you been able to return to your normal activities? Yes.    Do you have any questions about your discharge instructions: Diet   No. Medications  No. Follow up visit  No.  Do you have questions or concerns about your Care? No.  Actions: * If pain score is 4 or above: No action needed, pain <4.

## 2017-05-30 ENCOUNTER — Encounter: Payer: Self-pay | Admitting: Internal Medicine

## 2017-06-16 DIAGNOSIS — F419 Anxiety disorder, unspecified: Secondary | ICD-10-CM | POA: Diagnosis not present

## 2017-06-16 DIAGNOSIS — R112 Nausea with vomiting, unspecified: Secondary | ICD-10-CM | POA: Diagnosis not present

## 2017-06-19 DIAGNOSIS — F419 Anxiety disorder, unspecified: Secondary | ICD-10-CM | POA: Diagnosis not present

## 2017-06-19 DIAGNOSIS — K920 Hematemesis: Secondary | ICD-10-CM | POA: Diagnosis not present

## 2017-06-19 DIAGNOSIS — F329 Major depressive disorder, single episode, unspecified: Secondary | ICD-10-CM | POA: Diagnosis not present

## 2017-06-26 ENCOUNTER — Ambulatory Visit: Payer: BLUE CROSS/BLUE SHIELD | Admitting: Emergency Medicine

## 2017-06-30 DIAGNOSIS — F331 Major depressive disorder, recurrent, moderate: Secondary | ICD-10-CM | POA: Diagnosis not present

## 2017-06-30 DIAGNOSIS — F411 Generalized anxiety disorder: Secondary | ICD-10-CM | POA: Diagnosis not present

## 2017-07-10 DIAGNOSIS — F330 Major depressive disorder, recurrent, mild: Secondary | ICD-10-CM | POA: Diagnosis not present

## 2017-07-10 DIAGNOSIS — F411 Generalized anxiety disorder: Secondary | ICD-10-CM | POA: Diagnosis not present

## 2017-07-14 DIAGNOSIS — I10 Essential (primary) hypertension: Secondary | ICD-10-CM | POA: Diagnosis not present

## 2017-07-14 DIAGNOSIS — Z1211 Encounter for screening for malignant neoplasm of colon: Secondary | ICD-10-CM | POA: Diagnosis not present

## 2017-07-14 DIAGNOSIS — I2510 Atherosclerotic heart disease of native coronary artery without angina pectoris: Secondary | ICD-10-CM | POA: Diagnosis not present

## 2017-07-24 DIAGNOSIS — F331 Major depressive disorder, recurrent, moderate: Secondary | ICD-10-CM | POA: Diagnosis not present

## 2017-07-24 DIAGNOSIS — F411 Generalized anxiety disorder: Secondary | ICD-10-CM | POA: Diagnosis not present

## 2017-07-31 DIAGNOSIS — H5213 Myopia, bilateral: Secondary | ICD-10-CM | POA: Diagnosis not present

## 2017-08-07 DIAGNOSIS — F411 Generalized anxiety disorder: Secondary | ICD-10-CM | POA: Diagnosis not present

## 2017-08-07 DIAGNOSIS — F331 Major depressive disorder, recurrent, moderate: Secondary | ICD-10-CM | POA: Diagnosis not present

## 2017-08-09 DIAGNOSIS — F411 Generalized anxiety disorder: Secondary | ICD-10-CM | POA: Diagnosis not present

## 2017-08-09 DIAGNOSIS — F331 Major depressive disorder, recurrent, moderate: Secondary | ICD-10-CM | POA: Diagnosis not present

## 2017-09-06 DIAGNOSIS — F411 Generalized anxiety disorder: Secondary | ICD-10-CM | POA: Diagnosis not present

## 2017-09-06 DIAGNOSIS — F330 Major depressive disorder, recurrent, mild: Secondary | ICD-10-CM | POA: Diagnosis not present

## 2017-09-06 DIAGNOSIS — F3340 Major depressive disorder, recurrent, in remission, unspecified: Secondary | ICD-10-CM | POA: Insufficient documentation

## 2017-09-27 IMAGING — CR DG CHEST 2V
2 series · 2 of 2 positions shown · non-contrast
Comparison: None.

CLINICAL DATA: Patient is pre procedure for heart catheterization.
Hypertension and chest tightness.

EXAM:
CHEST  2 VIEW

[w chest pa]
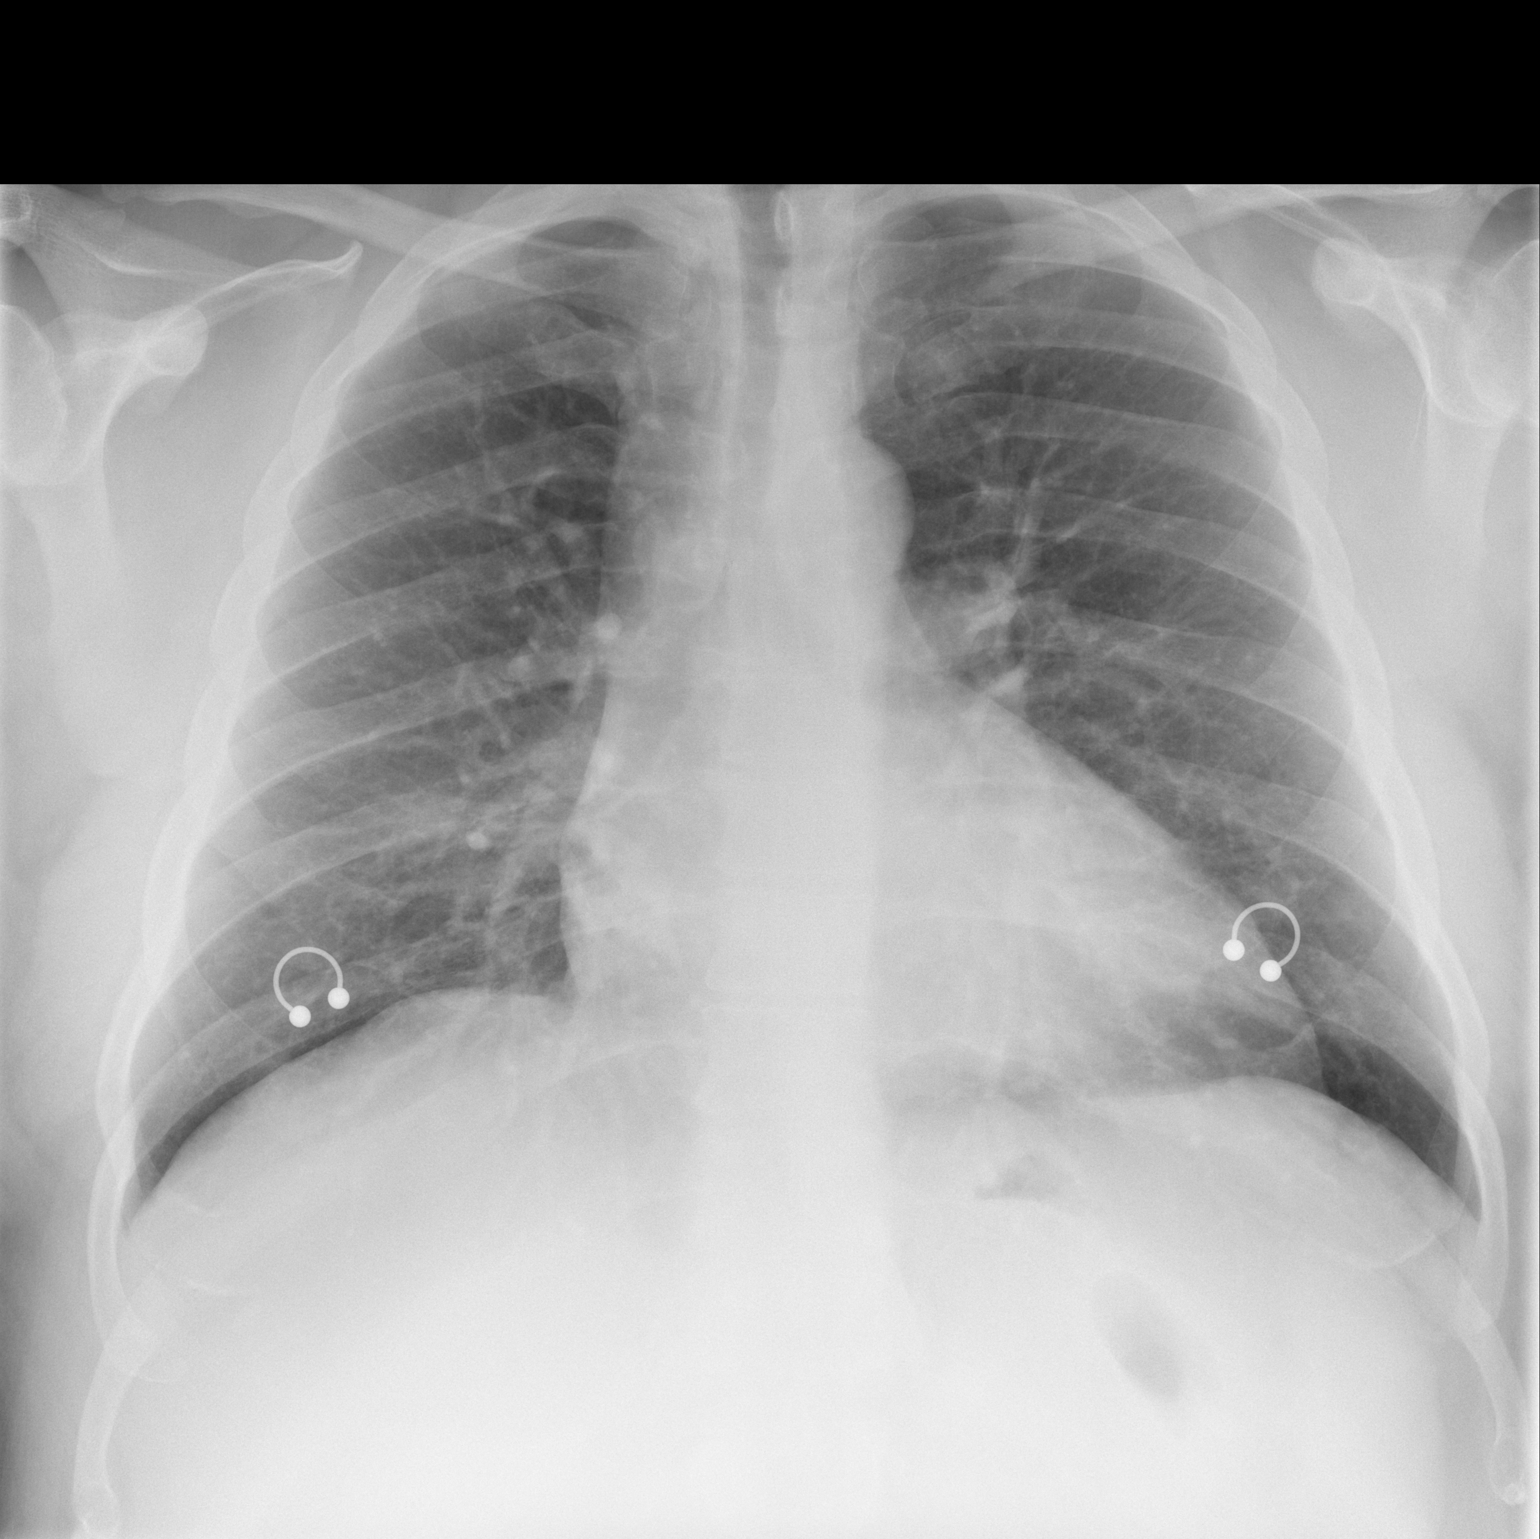

[w chest lat]
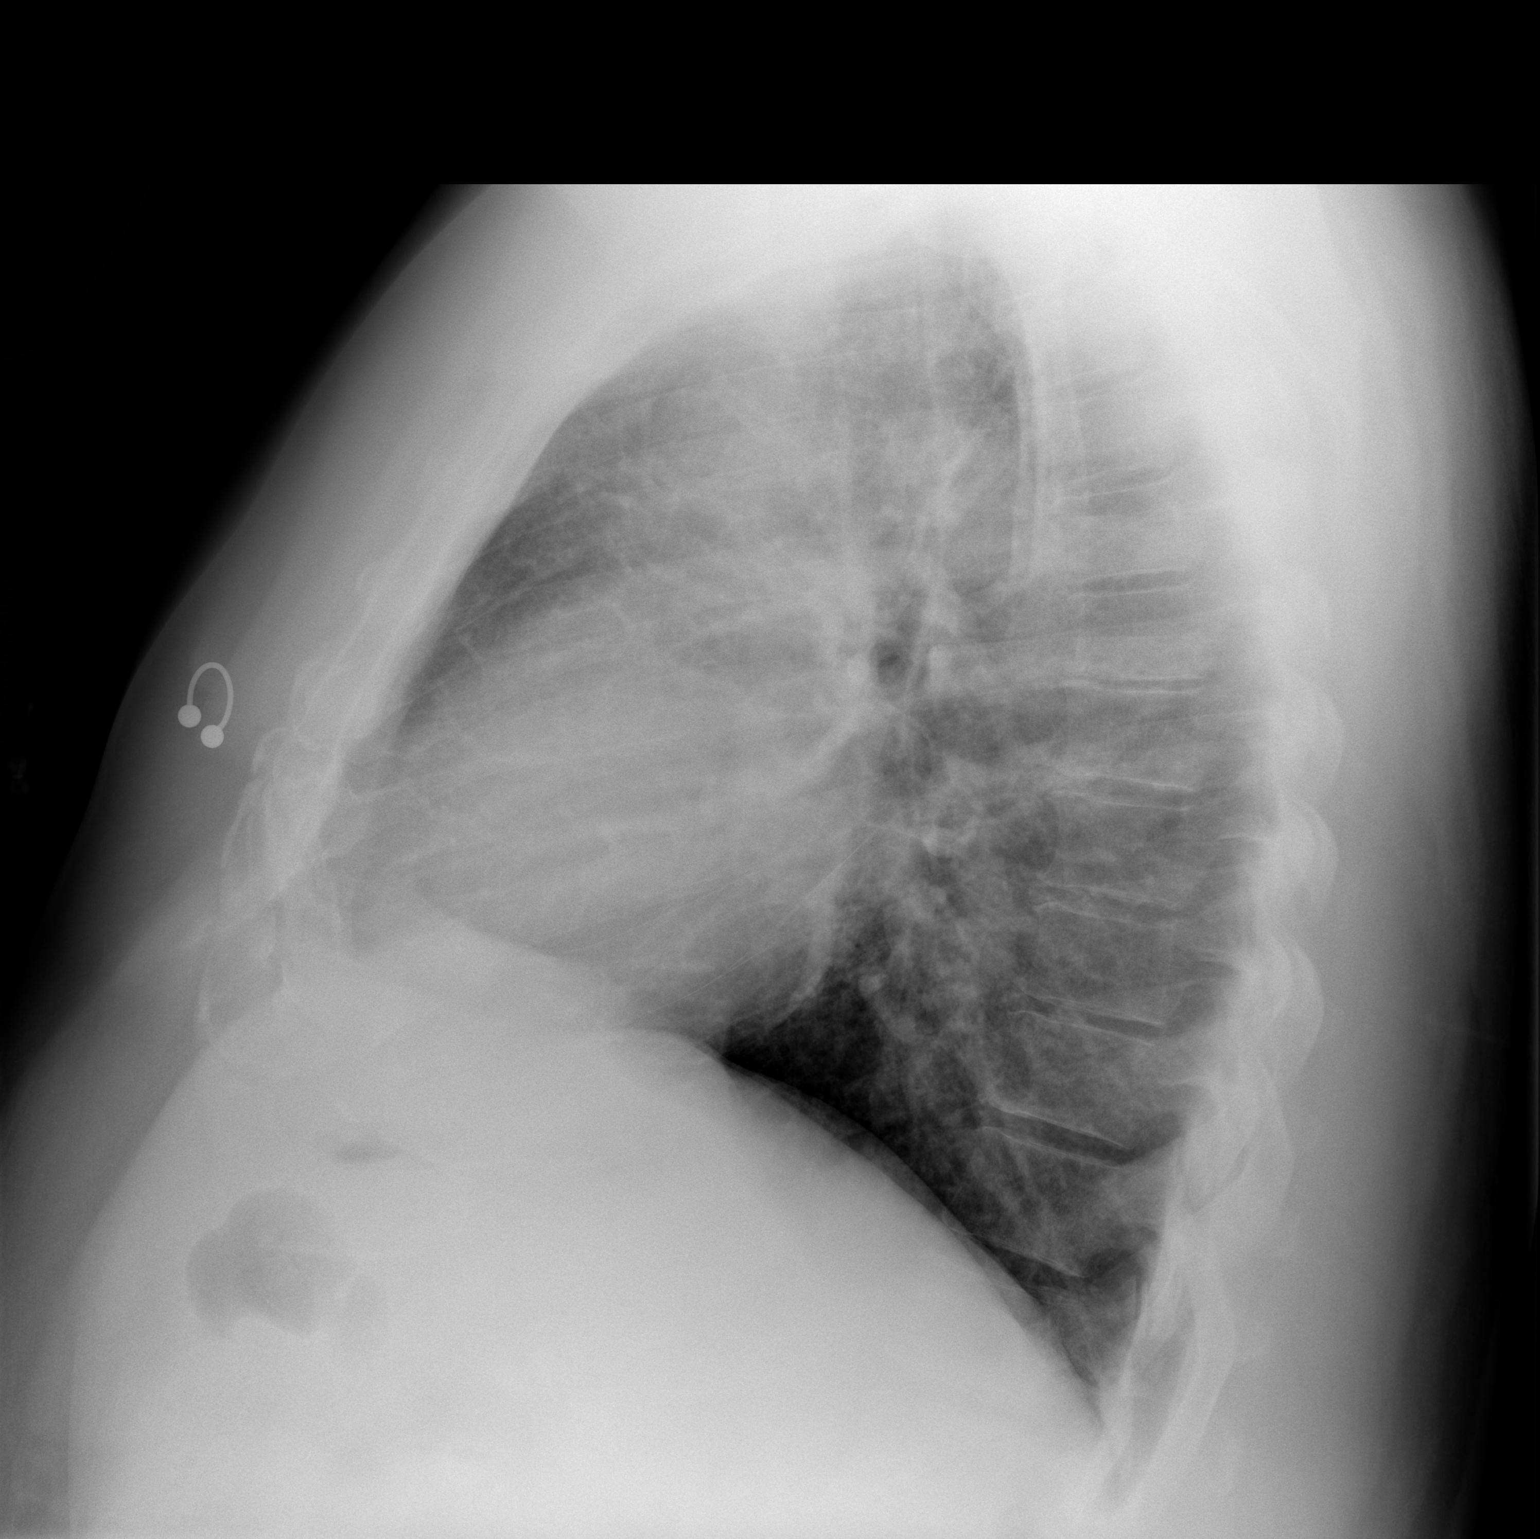

[2 of 2 positions shown; findings below may reference images not displayed]

FINDINGS: The mediastinal contour is normal. The heart size is mildly
enlarged. Both lungs are clear. The visualized skeletal structures
are unremarkable.
IMPRESSION: No active cardiopulmonary disease.

## 2017-10-09 DIAGNOSIS — I25810 Atherosclerosis of coronary artery bypass graft(s) without angina pectoris: Secondary | ICD-10-CM | POA: Diagnosis not present

## 2017-10-09 DIAGNOSIS — F418 Other specified anxiety disorders: Secondary | ICD-10-CM | POA: Diagnosis not present

## 2017-10-09 DIAGNOSIS — Z23 Encounter for immunization: Secondary | ICD-10-CM | POA: Diagnosis not present

## 2017-10-11 DIAGNOSIS — F411 Generalized anxiety disorder: Secondary | ICD-10-CM | POA: Diagnosis not present

## 2017-10-11 DIAGNOSIS — F331 Major depressive disorder, recurrent, moderate: Secondary | ICD-10-CM | POA: Diagnosis not present

## 2017-10-20 DIAGNOSIS — F411 Generalized anxiety disorder: Secondary | ICD-10-CM | POA: Diagnosis not present

## 2017-10-20 DIAGNOSIS — F330 Major depressive disorder, recurrent, mild: Secondary | ICD-10-CM | POA: Diagnosis not present

## 2017-12-04 DIAGNOSIS — F330 Major depressive disorder, recurrent, mild: Secondary | ICD-10-CM | POA: Diagnosis not present

## 2017-12-04 DIAGNOSIS — F411 Generalized anxiety disorder: Secondary | ICD-10-CM | POA: Diagnosis not present

## 2018-02-14 DIAGNOSIS — G43909 Migraine, unspecified, not intractable, without status migrainosus: Secondary | ICD-10-CM | POA: Diagnosis not present

## 2018-03-28 DIAGNOSIS — I2510 Atherosclerotic heart disease of native coronary artery without angina pectoris: Secondary | ICD-10-CM | POA: Diagnosis not present

## 2018-03-28 DIAGNOSIS — I10 Essential (primary) hypertension: Secondary | ICD-10-CM | POA: Diagnosis not present

## 2018-03-28 DIAGNOSIS — G44209 Tension-type headache, unspecified, not intractable: Secondary | ICD-10-CM | POA: Diagnosis not present

## 2018-04-12 DIAGNOSIS — F331 Major depressive disorder, recurrent, moderate: Secondary | ICD-10-CM | POA: Diagnosis not present

## 2018-04-12 DIAGNOSIS — F411 Generalized anxiety disorder: Secondary | ICD-10-CM | POA: Diagnosis not present

## 2018-04-12 DIAGNOSIS — Z79899 Other long term (current) drug therapy: Secondary | ICD-10-CM | POA: Diagnosis not present

## 2018-06-07 DIAGNOSIS — R42 Dizziness and giddiness: Secondary | ICD-10-CM | POA: Diagnosis not present

## 2018-06-07 DIAGNOSIS — H8120 Vestibular neuronitis, unspecified ear: Secondary | ICD-10-CM | POA: Diagnosis not present

## 2018-07-10 DIAGNOSIS — F331 Major depressive disorder, recurrent, moderate: Secondary | ICD-10-CM | POA: Diagnosis not present

## 2018-07-10 DIAGNOSIS — F411 Generalized anxiety disorder: Secondary | ICD-10-CM | POA: Diagnosis not present

## 2018-08-17 ENCOUNTER — Other Ambulatory Visit: Payer: Self-pay

## 2018-08-17 DIAGNOSIS — Z20822 Contact with and (suspected) exposure to covid-19: Secondary | ICD-10-CM

## 2018-08-18 LAB — NOVEL CORONAVIRUS, NAA: SARS-CoV-2, NAA: NOT DETECTED

## 2018-09-04 DIAGNOSIS — M9904 Segmental and somatic dysfunction of sacral region: Secondary | ICD-10-CM | POA: Diagnosis not present

## 2018-09-04 DIAGNOSIS — F419 Anxiety disorder, unspecified: Secondary | ICD-10-CM | POA: Diagnosis not present

## 2018-09-04 DIAGNOSIS — E291 Testicular hypofunction: Secondary | ICD-10-CM | POA: Diagnosis not present

## 2018-09-04 DIAGNOSIS — Z1211 Encounter for screening for malignant neoplasm of colon: Secondary | ICD-10-CM | POA: Diagnosis not present

## 2018-09-04 DIAGNOSIS — I2510 Atherosclerotic heart disease of native coronary artery without angina pectoris: Secondary | ICD-10-CM | POA: Diagnosis not present

## 2018-09-04 DIAGNOSIS — M545 Low back pain: Secondary | ICD-10-CM | POA: Diagnosis not present

## 2018-09-04 DIAGNOSIS — K635 Polyp of colon: Secondary | ICD-10-CM | POA: Diagnosis not present

## 2018-09-04 DIAGNOSIS — M9905 Segmental and somatic dysfunction of pelvic region: Secondary | ICD-10-CM | POA: Diagnosis not present

## 2018-09-04 DIAGNOSIS — M9903 Segmental and somatic dysfunction of lumbar region: Secondary | ICD-10-CM | POA: Diagnosis not present

## 2018-09-05 DIAGNOSIS — M9903 Segmental and somatic dysfunction of lumbar region: Secondary | ICD-10-CM | POA: Diagnosis not present

## 2018-09-05 DIAGNOSIS — M9904 Segmental and somatic dysfunction of sacral region: Secondary | ICD-10-CM | POA: Diagnosis not present

## 2018-09-05 DIAGNOSIS — M9905 Segmental and somatic dysfunction of pelvic region: Secondary | ICD-10-CM | POA: Diagnosis not present

## 2018-09-05 DIAGNOSIS — M545 Low back pain: Secondary | ICD-10-CM | POA: Diagnosis not present

## 2018-09-11 DIAGNOSIS — M9905 Segmental and somatic dysfunction of pelvic region: Secondary | ICD-10-CM | POA: Diagnosis not present

## 2018-09-11 DIAGNOSIS — M9903 Segmental and somatic dysfunction of lumbar region: Secondary | ICD-10-CM | POA: Diagnosis not present

## 2018-09-11 DIAGNOSIS — M545 Low back pain: Secondary | ICD-10-CM | POA: Diagnosis not present

## 2018-09-11 DIAGNOSIS — M9904 Segmental and somatic dysfunction of sacral region: Secondary | ICD-10-CM | POA: Diagnosis not present

## 2018-10-30 DIAGNOSIS — M979XXD Periprosthetic fracture around unspecified internal prosthetic joint, subsequent encounter: Secondary | ICD-10-CM | POA: Diagnosis not present

## 2018-10-30 DIAGNOSIS — M5410 Radiculopathy, site unspecified: Secondary | ICD-10-CM | POA: Diagnosis not present

## 2018-10-30 DIAGNOSIS — M9903 Segmental and somatic dysfunction of lumbar region: Secondary | ICD-10-CM | POA: Diagnosis not present

## 2018-10-30 DIAGNOSIS — M9905 Segmental and somatic dysfunction of pelvic region: Secondary | ICD-10-CM | POA: Diagnosis not present

## 2018-11-01 DIAGNOSIS — M9903 Segmental and somatic dysfunction of lumbar region: Secondary | ICD-10-CM | POA: Diagnosis not present

## 2018-11-01 DIAGNOSIS — M5410 Radiculopathy, site unspecified: Secondary | ICD-10-CM | POA: Diagnosis not present

## 2018-11-01 DIAGNOSIS — M979XXD Periprosthetic fracture around unspecified internal prosthetic joint, subsequent encounter: Secondary | ICD-10-CM | POA: Diagnosis not present

## 2018-11-01 DIAGNOSIS — M9905 Segmental and somatic dysfunction of pelvic region: Secondary | ICD-10-CM | POA: Diagnosis not present

## 2019-02-01 DIAGNOSIS — K529 Noninfective gastroenteritis and colitis, unspecified: Secondary | ICD-10-CM | POA: Diagnosis not present

## 2019-03-11 DIAGNOSIS — F330 Major depressive disorder, recurrent, mild: Secondary | ICD-10-CM | POA: Diagnosis not present

## 2019-03-11 DIAGNOSIS — F411 Generalized anxiety disorder: Secondary | ICD-10-CM | POA: Diagnosis not present

## 2019-04-18 DIAGNOSIS — R42 Dizziness and giddiness: Secondary | ICD-10-CM | POA: Diagnosis not present

## 2019-04-18 DIAGNOSIS — R7301 Impaired fasting glucose: Secondary | ICD-10-CM | POA: Insufficient documentation

## 2019-04-18 DIAGNOSIS — R55 Syncope and collapse: Secondary | ICD-10-CM | POA: Diagnosis not present

## 2019-04-18 DIAGNOSIS — Z9989 Dependence on other enabling machines and devices: Secondary | ICD-10-CM | POA: Insufficient documentation

## 2019-04-18 DIAGNOSIS — T50905A Adverse effect of unspecified drugs, medicaments and biological substances, initial encounter: Secondary | ICD-10-CM | POA: Diagnosis not present

## 2019-04-23 DIAGNOSIS — R42 Dizziness and giddiness: Secondary | ICD-10-CM | POA: Diagnosis not present

## 2019-04-23 DIAGNOSIS — I081 Rheumatic disorders of both mitral and tricuspid valves: Secondary | ICD-10-CM | POA: Diagnosis not present

## 2019-04-23 DIAGNOSIS — I6523 Occlusion and stenosis of bilateral carotid arteries: Secondary | ICD-10-CM | POA: Diagnosis not present

## 2019-04-30 DIAGNOSIS — R42 Dizziness and giddiness: Secondary | ICD-10-CM | POA: Diagnosis not present

## 2019-05-08 DIAGNOSIS — E291 Testicular hypofunction: Secondary | ICD-10-CM | POA: Diagnosis not present

## 2019-05-08 DIAGNOSIS — R42 Dizziness and giddiness: Secondary | ICD-10-CM | POA: Diagnosis not present

## 2019-05-08 DIAGNOSIS — I10 Essential (primary) hypertension: Secondary | ICD-10-CM | POA: Diagnosis not present

## 2019-06-11 DIAGNOSIS — R7303 Prediabetes: Secondary | ICD-10-CM | POA: Diagnosis not present

## 2019-06-11 DIAGNOSIS — F3341 Major depressive disorder, recurrent, in partial remission: Secondary | ICD-10-CM | POA: Diagnosis not present

## 2019-06-11 DIAGNOSIS — E291 Testicular hypofunction: Secondary | ICD-10-CM | POA: Diagnosis not present

## 2019-06-11 DIAGNOSIS — R5383 Other fatigue: Secondary | ICD-10-CM | POA: Diagnosis not present

## 2019-06-11 DIAGNOSIS — I10 Essential (primary) hypertension: Secondary | ICD-10-CM | POA: Diagnosis not present

## 2019-06-11 DIAGNOSIS — F411 Generalized anxiety disorder: Secondary | ICD-10-CM | POA: Diagnosis not present

## 2019-06-24 DIAGNOSIS — M62838 Other muscle spasm: Secondary | ICD-10-CM | POA: Diagnosis not present

## 2019-06-24 DIAGNOSIS — M546 Pain in thoracic spine: Secondary | ICD-10-CM | POA: Diagnosis not present

## 2019-06-24 DIAGNOSIS — M9905 Segmental and somatic dysfunction of pelvic region: Secondary | ICD-10-CM | POA: Diagnosis not present

## 2019-06-24 DIAGNOSIS — M9902 Segmental and somatic dysfunction of thoracic region: Secondary | ICD-10-CM | POA: Diagnosis not present

## 2019-07-23 DIAGNOSIS — H5213 Myopia, bilateral: Secondary | ICD-10-CM | POA: Diagnosis not present

## 2019-07-24 DIAGNOSIS — E291 Testicular hypofunction: Secondary | ICD-10-CM | POA: Diagnosis not present

## 2019-07-29 DIAGNOSIS — E291 Testicular hypofunction: Secondary | ICD-10-CM | POA: Diagnosis not present

## 2019-08-16 DIAGNOSIS — R110 Nausea: Secondary | ICD-10-CM | POA: Diagnosis not present

## 2019-08-16 DIAGNOSIS — R197 Diarrhea, unspecified: Secondary | ICD-10-CM | POA: Diagnosis not present

## 2019-08-16 DIAGNOSIS — Z03818 Encounter for observation for suspected exposure to other biological agents ruled out: Secondary | ICD-10-CM | POA: Diagnosis not present

## 2019-09-05 DIAGNOSIS — F329 Major depressive disorder, single episode, unspecified: Secondary | ICD-10-CM | POA: Diagnosis not present

## 2019-09-05 DIAGNOSIS — F411 Generalized anxiety disorder: Secondary | ICD-10-CM | POA: Diagnosis not present

## 2019-09-05 DIAGNOSIS — Z79899 Other long term (current) drug therapy: Secondary | ICD-10-CM | POA: Diagnosis not present

## 2019-09-11 DIAGNOSIS — R112 Nausea with vomiting, unspecified: Secondary | ICD-10-CM | POA: Diagnosis not present

## 2019-10-30 DIAGNOSIS — R1111 Vomiting without nausea: Secondary | ICD-10-CM | POA: Diagnosis not present

## 2019-12-03 DIAGNOSIS — M545 Low back pain, unspecified: Secondary | ICD-10-CM | POA: Diagnosis not present

## 2019-12-03 DIAGNOSIS — M9904 Segmental and somatic dysfunction of sacral region: Secondary | ICD-10-CM | POA: Diagnosis not present

## 2019-12-03 DIAGNOSIS — M9903 Segmental and somatic dysfunction of lumbar region: Secondary | ICD-10-CM | POA: Diagnosis not present

## 2019-12-03 DIAGNOSIS — M9905 Segmental and somatic dysfunction of pelvic region: Secondary | ICD-10-CM | POA: Diagnosis not present

## 2019-12-17 DIAGNOSIS — R7303 Prediabetes: Secondary | ICD-10-CM | POA: Diagnosis not present

## 2019-12-17 DIAGNOSIS — E291 Testicular hypofunction: Secondary | ICD-10-CM | POA: Diagnosis not present

## 2019-12-17 DIAGNOSIS — E782 Mixed hyperlipidemia: Secondary | ICD-10-CM | POA: Diagnosis not present

## 2019-12-17 DIAGNOSIS — I10 Essential (primary) hypertension: Secondary | ICD-10-CM | POA: Diagnosis not present

## 2019-12-17 DIAGNOSIS — R5383 Other fatigue: Secondary | ICD-10-CM | POA: Diagnosis not present

## 2019-12-17 DIAGNOSIS — E559 Vitamin D deficiency, unspecified: Secondary | ICD-10-CM | POA: Diagnosis not present

## 2019-12-20 DIAGNOSIS — Z79899 Other long term (current) drug therapy: Secondary | ICD-10-CM | POA: Diagnosis not present

## 2019-12-20 DIAGNOSIS — F329 Major depressive disorder, single episode, unspecified: Secondary | ICD-10-CM | POA: Diagnosis not present

## 2019-12-20 DIAGNOSIS — F411 Generalized anxiety disorder: Secondary | ICD-10-CM | POA: Diagnosis not present

## 2020-01-27 DIAGNOSIS — M545 Low back pain, unspecified: Secondary | ICD-10-CM | POA: Diagnosis not present

## 2020-01-27 DIAGNOSIS — M9905 Segmental and somatic dysfunction of pelvic region: Secondary | ICD-10-CM | POA: Diagnosis not present

## 2020-01-27 DIAGNOSIS — M9903 Segmental and somatic dysfunction of lumbar region: Secondary | ICD-10-CM | POA: Diagnosis not present

## 2020-01-27 DIAGNOSIS — M9904 Segmental and somatic dysfunction of sacral region: Secondary | ICD-10-CM | POA: Diagnosis not present

## 2020-01-29 DIAGNOSIS — M9904 Segmental and somatic dysfunction of sacral region: Secondary | ICD-10-CM | POA: Diagnosis not present

## 2020-01-29 DIAGNOSIS — M9903 Segmental and somatic dysfunction of lumbar region: Secondary | ICD-10-CM | POA: Diagnosis not present

## 2020-01-29 DIAGNOSIS — M545 Low back pain, unspecified: Secondary | ICD-10-CM | POA: Diagnosis not present

## 2020-01-29 DIAGNOSIS — M9905 Segmental and somatic dysfunction of pelvic region: Secondary | ICD-10-CM | POA: Diagnosis not present

## 2020-02-03 ENCOUNTER — Ambulatory Visit (INDEPENDENT_AMBULATORY_CARE_PROVIDER_SITE_OTHER): Payer: BLUE CROSS/BLUE SHIELD | Admitting: Neurology

## 2020-02-03 ENCOUNTER — Encounter: Payer: Self-pay | Admitting: Neurology

## 2020-02-03 VITALS — BP 122/84 | HR 79 | Ht 72.0 in | Wt 271.0 lb

## 2020-02-03 DIAGNOSIS — M9903 Segmental and somatic dysfunction of lumbar region: Secondary | ICD-10-CM | POA: Diagnosis not present

## 2020-02-03 DIAGNOSIS — M9904 Segmental and somatic dysfunction of sacral region: Secondary | ICD-10-CM | POA: Diagnosis not present

## 2020-02-03 DIAGNOSIS — G4733 Obstructive sleep apnea (adult) (pediatric): Secondary | ICD-10-CM | POA: Diagnosis not present

## 2020-02-03 DIAGNOSIS — Z9989 Dependence on other enabling machines and devices: Secondary | ICD-10-CM | POA: Diagnosis not present

## 2020-02-03 DIAGNOSIS — G4719 Other hypersomnia: Secondary | ICD-10-CM

## 2020-02-03 DIAGNOSIS — M545 Low back pain, unspecified: Secondary | ICD-10-CM | POA: Diagnosis not present

## 2020-02-03 DIAGNOSIS — M9905 Segmental and somatic dysfunction of pelvic region: Secondary | ICD-10-CM | POA: Diagnosis not present

## 2020-02-03 NOTE — Progress Notes (Signed)
Subjective:    Patient ID: Martin Guzman is a 55 y.o. male.  HPI     Star Age, MD, PhD [facility] [address], [address]O. [place], [place] [address]  Dear Mitzi Hansen,   I saw your patient, Martin Guzman, upon your kind request, in my sleep clinic today for initial consultation of his sleep disorder, in particular evaluation of his prior diagnosis of obstructive sleep apnea.  The patient is unaccompanied today.  As you know, Martin Guzman is a 55 year old right-handed gentleman with an underlying medical history ofcoronary artery disease, status post stent placement, depression, anxiety, reflux disease, hypertension, headaches, restless leg syndrome, and obesity, who was previously diagnosed with obstructive sleep apnea and placed on CPAP therapy.  I reviewed your office note from 12/17/2019.  He had blood work at the time including CMP, TSH, A1c, testosterone level, vitamin D.  I was able to review the results.  A1c was 5.6, testosterone 380.8, BMP unremarkable, liver function unremarkable, vitamin D low at 21.4.  I was also able to review his prior sleep study from 08/30/2006.  He had severe obstructive sleep apnea with an AHI of 73.6/h, O2 nadir 63%.  He has been on CPAP therapy for years.  He is on his third machine.  His DME company is [facility] [employer].  He uses nasal pillows.  He is compliant with treatment and reports benefit from his CPAP.  He denies any residual snoring from it.  He does not have any significant restless leg symptoms at this time and reports that his wife used to have more problems with it and when they slept in the same bed, her restlessness would be disruptive to their sleep.  They do sleep in separate bedrooms now.  He goes to bed around 9 and rise time is around 6. He used to be followed [facility] but is not sure when he last had an appointment with a sleep specialist.  He has had occasional morning headaches and takes Tylenol  as needed.  He does not drink caffeine daily.  He does not have night to night nocturia.  He reports a family history of sleep apnea affecting her sister who also has a CPAP machine.  He lives with his wife and 34 year old daughter.  He works as a Nutritional therapist.  He works from 7 AM to 3:30 PM.  He has lost some weight in the recent past.  He has 4 dogs and 1 cat in the household.  He does have daytime somnolence and reports that he does not tend to eat at lunchtime at work but he uses his lunchtime to take a quick nap, they have a couch at work and he naps on a during lunchtime.  He is followed by psychiatry and sees them typically every 3 months.  He is on several medications including potentially sedating medications.  He has been on alprazolam as needed, Elavil, Abilify, BuSpar, and long-acting fluvoxamine. His Epworth sleepiness score is 16 out of 24, fatigue severity score is 53 out of 63.  He avoids long distance driving and when he has to be driven to a place of work, he reports that someone else typically drives.  I reviewed his CPAP compliance data from the past month, starting 01/05/2020.  He has used his machine with the exception of 2 nights.  His compliance for more than 4 hours is nearly 90%.  Average usage of 7 hours and 41 minutes, residual AHI at  goal at 0.7/h, leak acceptable with a 95th percentile at 14.6 L/min on a pressure of 10 cm with EPR of 3.    His Past Medical History Is Significant For: Past Medical History:  Diagnosis Date  . Anxiety   . Anxiety and depression   . Arthritis   . CAD S/P PCI x 2 to LAD 12/14/2015   Mid LAD lesion, 70 %stenosed. --> (Resolute Onyx 3.0 x15), Dist LAD lesion, 99 %stenosed --> (Resolute Onyx 2.25x15)   . Coronary artery disease   . Depression   . GERD (gastroesophageal reflux disease)   . Headache   . Hypertension   . Laceration of leg not thigh   . Migraine   . Progressive angina ([place]) 12/14/2015   Cardiac Cath with m & d LAD lesions - PCI x 2  DES stents    His Past Surgical History Is Significant For: Past Surgical History:  Procedure Laterality Date  . CARDIAC CATHETERIZATION N/A 12/14/2015   Procedure: Left Heart Cath and Coronary Angiography;  Surgeon: [name], MD;  Location: [facility];  Service: Cardiovascular;  Laterality: N/A;  . CARDIAC CATHETERIZATION N/A 12/14/2015   Procedure: Coronary Stent Intervention;  Surgeon: [name], MD;  Location: [facility];  Service: Cardiovascular;  Laterality: N/A;  . CORONARY STENT PLACEMENT  12/14/2015   Mid LAD lesion, 70 %stenosed.  Marland Kitchen SHOULDER SURGERY Left 1998    His Family History Is Significant For: Family History  Problem Relation Age of Onset  . Heart failure Mother   . Hypertension Mother   . High blood pressure Mother   . Cancer Father   . High blood pressure Father     His Social History Is Significant For: Social History   Socioeconomic History  . Marital status: Married    Spouse name: Not on file  . Number of children: 1  . Years of education: Not on file  . Highest education level: Not on file  Occupational History  . Not on file  Tobacco Use  . Smoking status: Never Smoker  . Smokeless tobacco: Never Used  Vaping Use  . Vaping Use: Never used  Substance and Sexual Activity  . Alcohol use: Yes    Comment: 3 drinks per week  . Drug use: No  . Sexual activity: Not on file  Other Topics Concern  . Not on file  Social History Narrative  . Not on file   Social Determinants of Health   Financial Resource Strain: Not on file  Food Insecurity: Not on file  Transportation Needs: Not on file  Physical Activity: Not on file  Stress: Not on file  Social Connections: Not on file    His Allergies Are:  Allergies  Allergen Reactions  . Bee Venom Other (See Comments)  . Shellfish Allergy Other (See Comments)    Lip numbing.   :   His Current Medications Are:  Outpatient Encounter Medications as of 02/03/2020   Medication Sig  . ALPRAZolam (NIRAVAM) 0.25 MG dissolvable tablet Take 0.25 mg by mouth at bedtime as needed for anxiety.   Marland Kitchen amitriptyline (ELAVIL) 10 MG tablet 1 tablet  . ARIPiprazole (ABILIFY) 5 MG tablet 1 tablet  . aspirin 81 MG tablet Take 81 mg by mouth daily.  Marland Kitchen atorvastatin (LIPITOR) 80 MG tablet Take 1 tablet (80 mg total) by mouth daily at 10 pm.  . busPIRone (BUSPAR) 10 MG tablet   . Cholecalciferol (VITAMIN D) 2000 units tablet Take  2,000 Units by mouth daily.  . clopidogrel (PLAVIX) 75 MG tablet Take 1 tablet (75 mg total) by mouth daily with breakfast.  . Fluvoxamine Maleate 150 MG CP24 Take 150 mg by mouth daily.  Marland Kitchen lisinopril (PRINIVIL,ZESTRIL) 40 MG tablet Take 40 mg by mouth daily.  . metoprolol succinate (TOPROL-XL) 50 MG 24 hr tablet Take 50 mg by mouth daily. Take with or immediately following a meal.  . nitroGLYCERIN (NITROSTAT) 0.4 MG SL tablet Place 0.4 mg under the tongue every 5 (five) minutes as needed for chest pain.  . Ondansetron HCl (ZOFRAN PO) Take by mouth.  . testosterone cypionate (DEPOTESTOSTERONE CYPIONATE) 200 MG/ML injection Inject 200 mg into the muscle every 14 (fourteen) days.  . [DISCONTINUED] ARIPiprazole (ABILIFY) 2 MG tablet Take 2 mg by mouth every evening.    Facility-Administered Encounter Medications as of 02/03/2020  Medication  . 0.9 %  sodium chloride infusion  :  Review of Systems:  Out of a complete 14 point review of systems, all are reviewed and negative with the exception of these symptoms as listed below: Review of Systems  Neurological:       Here for sleep consult. Prior sleep study back in 2008, pt is on CPAP- reports he has been on his current cpap for a while. He sts he feels like the pressure may need to be increased. Overall his machine works well for him.  Epworth Sleepiness Scale 0= would never doze 1= slight chance of dozing 2= moderate chance of dozing 3= high chance of dozing  Sitting and reading:2 Watching  TV:2 Sitting inactive in a public place (ex. Theater or meeting):1 As a passenger in a car for an hour without a break:3 Lying down to rest in the afternoon:3 Sitting and talking to someone:1 Sitting quietly after lunch (no alcohol):3 In a car, while stopped in traffic:1 Total:16     Objective:  Neurological Exam  Physical Exam Physical Examination:   Vitals:   02/03/20 1516  BP: 122/84  Pulse: 79  SpO2: 96%   General Examination: The patient is a very pleasant 55 y.o. male in no acute distress. He appears well-developed and well-nourished and well groomed.   HEENT: Normocephalic, atraumatic, pupils are equal, round and reactive to light.  He wears corrective eyeglasses.  Extraocular tracking is well preserved, hearing is grossly intact.  Airway examination reveals mild mouth dryness, adequate dental hygiene, tongue protrudes centrally and palate elevates symmetrically.  Moderate airway crowding is noted secondary to widened uvula, tonsillar size is about 1+, Mallampati class III.  Neck circumference is 20 1/4 inches.   Chest: Clear to auscultation without wheezing, rhonchi or crackles noted.  Heart: S1+S2+0, regular and normal without murmurs, rubs or gallops noted.   Abdomen: Soft, non-tender and non-distended with normal bowel sounds appreciated on auscultation.  Extremities: There is no pitting edema in the distal lower extremities bilaterally.   Skin: Warm and dry without trophic changes noted. There are no varicose veins.  Musculoskeletal: exam reveals no obvious joint deformities, tenderness or joint swelling or erythema.   Neurologically:  Mental status: The patient is awake, alert and oriented in all 4 spheres. His immediate and remote memory, attention, language skills and fund of knowledge are appropriate. There is no evidence of aphasia, agnosia, apraxia or anomia. Speech is clear with normal prosody and enunciation. Thought process is linear. Mood is normal and  affect is normal.  Cranial nerves II - XII are as described above under HEENT exam. In addition:  shoulder shrug is normal with equal shoulder height noted. Motor exam: Normal bulk, strength and tone is noted. There is no drift or tremor.  Romberg is negative.  Fine motor skills are well preserved in the upper and lower extremities, cerebellar testing shows no dysmetria or intention tremor.  No gait ataxia.   Sensory exam: intact to light touch in the upper and lower extremities.  Gait, station and balance: He stands without difficulty, he walks without problems, posture is age-appropriate.   Assessment and Plan:   In summary, KIN GALBRAITH is a very pleasant 55 y.o.-year old male  with an underlying medical history ofcoronary artery disease, status post stent placement, depression, anxiety, reflux disease, hypertension, headaches, restless leg syndrome, and obesity, who presents for evaluation of his obstructive sleep apnea.  He was diagnosed in 2008 with severe obstructive sleep apnea and has been on CPAP therapy for years.  He is currently on his third machine, machine was given to him in February 2018.  He has been up-to-date with his supplies through his DME company and is compliant with treatment.  He is commended for his treatment adherence, apnea scores are at goal, leak acceptable.  He does have residual sleepiness which is likely, at least in part due to his medication regimen.  He is advised to talk to his psychiatrist about his daytime sleepiness and potential medication side effects.  He is generally without any sleep-related complaints.  He has been cautious about driving.  He has not had any issues falling asleep at work.  He does take a lunchtime nap at work.  He is strongly discouraged from driving when sleepy.  He is advised to continue using his CPAP regularly and follow-up routinely in sleep clinic in 1 year, he can see one of our nurse practitioners at the time.  He is advised that we  do not need to change his pressure settings and the mask fit is good. I answered all his questions today and he was in agreement. Thank you very much for allowing me to participate in the care of this nice patient. If I can be of any further assistance to you please do not hesitate to call me at [phone].  Sincerely,   Star Age, MD, PhD

## 2020-02-03 NOTE — Patient Instructions (Addendum)
It was nice to meet you today.  You are compliant with your CPAP and your apnea score is good, meaning that your pressure is adequate for your sleep apnea treatment.  The air leakage from the mask is not too high, generally speaking you can continue with the current mask and the pressure setting.  Please continue to use your CPAP regularly. While your insurance requires that you use CPAP at least 4 hours each night on 70% of the nights, I recommend, that you not skip any nights and use it throughout the night if you can. Getting used to CPAP and staying with the treatment long term does take time and patience and discipline. Untreated obstructive sleep apnea when it is moderate to severe can have an adverse impact on cardiovascular health and raise her risk for heart disease, arrhythmias, hypertension, congestive heart failure, stroke and diabetes. Untreated obstructive sleep apnea causes sleep disruption, nonrestorative sleep, and sleep deprivation. This can have an impact on your day to day functioning and cause daytime sleepiness and impairment of cognitive function, memory loss, mood disturbance, and problems focussing. Using CPAP regularly can improve these symptoms.  Your sleepiness during the day may be in part from taking several potentially sedating medications.  Please discuss medication management with your psychiatrist at the next appointment.  Please continue to work on weight loss.  Do not drive when feeling sleepy! From the sleep apnea standpoint we do not need any additional testing at this time.  You are not quite eligible for a new machine yet.  We can revisit rechecking your sleep apnea with another sleep study next year.  Please follow-up routinely to see one of our nurse practitioners in 1 year.

## 2020-02-20 DIAGNOSIS — F330 Major depressive disorder, recurrent, mild: Secondary | ICD-10-CM | POA: Diagnosis not present

## 2020-02-20 DIAGNOSIS — F411 Generalized anxiety disorder: Secondary | ICD-10-CM | POA: Diagnosis not present

## 2020-02-24 ENCOUNTER — Telehealth: Payer: Self-pay | Admitting: Neurology

## 2020-02-24 NOTE — Telephone Encounter (Signed)
Pt. states that prescription hasn't been sent over to [facility] & is asking why. Please advise.

## 2020-02-24 NOTE — Telephone Encounter (Signed)
I called the pt back and advised we had received a fax order form from [facility] on 02/20/20 for nasal mask, nasal cannula, headgear, tubing, filter and water chamber for humidifier.  I advised that Dr. Marena Chancy signed off on the order on 02/20/2020 and we sent the order back to [facility] and received confirmation of fax transmission.  Patient verbalized understanding and states he will check back with [facility], reports he has not check in with him since Thursday.

## 2020-02-25 DIAGNOSIS — G4733 Obstructive sleep apnea (adult) (pediatric): Secondary | ICD-10-CM | POA: Diagnosis not present

## 2020-02-27 DIAGNOSIS — G43111 Migraine with aura, intractable, with status migrainosus: Secondary | ICD-10-CM | POA: Diagnosis not present

## 2020-04-01 DIAGNOSIS — F419 Anxiety disorder, unspecified: Secondary | ICD-10-CM | POA: Diagnosis not present

## 2020-04-01 DIAGNOSIS — F331 Major depressive disorder, recurrent, moderate: Secondary | ICD-10-CM | POA: Diagnosis not present

## 2020-04-07 DIAGNOSIS — F419 Anxiety disorder, unspecified: Secondary | ICD-10-CM | POA: Diagnosis not present

## 2020-04-07 DIAGNOSIS — F331 Major depressive disorder, recurrent, moderate: Secondary | ICD-10-CM | POA: Diagnosis not present

## 2020-04-14 DIAGNOSIS — Z79891 Long term (current) use of opiate analgesic: Secondary | ICD-10-CM | POA: Diagnosis not present

## 2020-04-16 DIAGNOSIS — F419 Anxiety disorder, unspecified: Secondary | ICD-10-CM | POA: Diagnosis not present

## 2020-04-16 DIAGNOSIS — F331 Major depressive disorder, recurrent, moderate: Secondary | ICD-10-CM | POA: Diagnosis not present

## 2020-05-07 DIAGNOSIS — F419 Anxiety disorder, unspecified: Secondary | ICD-10-CM | POA: Diagnosis not present

## 2020-05-07 DIAGNOSIS — F331 Major depressive disorder, recurrent, moderate: Secondary | ICD-10-CM | POA: Diagnosis not present

## 2020-05-22 DIAGNOSIS — F331 Major depressive disorder, recurrent, moderate: Secondary | ICD-10-CM | POA: Diagnosis not present

## 2020-05-22 DIAGNOSIS — F419 Anxiety disorder, unspecified: Secondary | ICD-10-CM | POA: Diagnosis not present

## 2020-06-04 DIAGNOSIS — F419 Anxiety disorder, unspecified: Secondary | ICD-10-CM | POA: Diagnosis not present

## 2020-06-04 DIAGNOSIS — F331 Major depressive disorder, recurrent, moderate: Secondary | ICD-10-CM | POA: Diagnosis not present

## 2020-06-05 DIAGNOSIS — F419 Anxiety disorder, unspecified: Secondary | ICD-10-CM | POA: Diagnosis not present

## 2020-06-05 DIAGNOSIS — F331 Major depressive disorder, recurrent, moderate: Secondary | ICD-10-CM | POA: Diagnosis not present

## 2020-07-03 DIAGNOSIS — F419 Anxiety disorder, unspecified: Secondary | ICD-10-CM | POA: Diagnosis not present

## 2020-07-03 DIAGNOSIS — F331 Major depressive disorder, recurrent, moderate: Secondary | ICD-10-CM | POA: Diagnosis not present

## 2020-07-17 DIAGNOSIS — F331 Major depressive disorder, recurrent, moderate: Secondary | ICD-10-CM | POA: Diagnosis not present

## 2020-07-17 DIAGNOSIS — F419 Anxiety disorder, unspecified: Secondary | ICD-10-CM | POA: Diagnosis not present

## 2020-07-29 DIAGNOSIS — R7303 Prediabetes: Secondary | ICD-10-CM | POA: Diagnosis not present

## 2020-07-29 DIAGNOSIS — R42 Dizziness and giddiness: Secondary | ICD-10-CM | POA: Diagnosis not present

## 2020-07-29 DIAGNOSIS — I10 Essential (primary) hypertension: Secondary | ICD-10-CM | POA: Diagnosis not present

## 2020-08-03 DIAGNOSIS — F331 Major depressive disorder, recurrent, moderate: Secondary | ICD-10-CM | POA: Diagnosis not present

## 2020-08-03 DIAGNOSIS — R55 Syncope and collapse: Secondary | ICD-10-CM | POA: Diagnosis not present

## 2020-08-03 DIAGNOSIS — F419 Anxiety disorder, unspecified: Secondary | ICD-10-CM | POA: Diagnosis not present

## 2020-08-04 ENCOUNTER — Telehealth: Payer: Self-pay | Admitting: Cardiovascular Disease

## 2020-08-04 NOTE — Telephone Encounter (Signed)
Spoke to patient he stated since last Thurs 7/28 he has been having syncopal episodes.Stated he has no warning.He saw PCP last Thurs and he made some medication adjustments.He cannot tell med changes have helped.No chest pain.No sob.He would like to be seen.No extender appointments available.Appointment scheduled with Dr.Berry 8/23 at 2:30 pm.Advised to go to ED if needed.

## 2020-08-04 NOTE — Telephone Encounter (Signed)
Pt c/o Syncope: STAT if syncope occurred within 30 minutes and pt complains of lightheadedness High Priority if episode of passing out, completely, today or in last 24 hours   Did you pass out today?  1 time today    When is the last time you passed out? Todayyes- 3 times last week   Has this occurred multiple times? yes  Did you have any symptoms prior to passing out? A little dizzy at sometimes and sometimes no warning, just pass out

## 2020-08-04 NOTE — Telephone Encounter (Signed)
Patient called PCP and he is on vacation.Advised to go to ED if syncope  continues.

## 2020-08-25 ENCOUNTER — Other Ambulatory Visit: Payer: Self-pay

## 2020-08-25 ENCOUNTER — Ambulatory Visit (INDEPENDENT_AMBULATORY_CARE_PROVIDER_SITE_OTHER): Payer: BLUE CROSS/BLUE SHIELD | Admitting: Cardiovascular Disease

## 2020-08-25 ENCOUNTER — Encounter: Payer: Self-pay | Admitting: Cardiovascular Disease

## 2020-08-25 DIAGNOSIS — E785 Hyperlipidemia, unspecified: Secondary | ICD-10-CM

## 2020-08-25 DIAGNOSIS — I2510 Atherosclerotic heart disease of native coronary artery without angina pectoris: Secondary | ICD-10-CM

## 2020-08-25 DIAGNOSIS — G4733 Obstructive sleep apnea (adult) (pediatric): Secondary | ICD-10-CM | POA: Diagnosis not present

## 2020-08-25 DIAGNOSIS — Z9861 Coronary angioplasty status: Secondary | ICD-10-CM

## 2020-08-25 DIAGNOSIS — I10 Essential (primary) hypertension: Secondary | ICD-10-CM | POA: Diagnosis not present

## 2020-08-25 DIAGNOSIS — R55 Syncope and collapse: Secondary | ICD-10-CM | POA: Insufficient documentation

## 2020-08-25 NOTE — Progress Notes (Signed)
08/25/2020 Martin Guzman   22-Jun-1965  [id number]  Primary Physician [name], FNP Primary Cardiologist: [name] MD Martin Guzman  HPI:  Martin Guzman is a 55 y.o.  mildly overweight married Caucasian male father of one 65 year old daughter . I last saw him in the office 06/21/2016. He was referred by [name] as well as his PCP, [name] for cardiovascular evaluation because of new-onset chest pain. His risk factors include treated hypertension and hyperlipidemia untreated according to the chart. He does have obstructive sleep apnea on sleep. He does not smoke nor is he diabetic. He has a strong family history for heart disease with a mother who has apparently 75 stents by his account and a brother who had stenting at age 14. New-onset chest pain approximately 4 weeks ago has had 6 episodes since. Currently during times of stress and exercise with left upper extremity radiation. He did have a routine GXT at the hospital by [name] on 11/2015 which was nonischemic although he developed substernal chest pain relieved with sublingual nitroglycerin. I elected to proceed with outpatient radial diagnostic coronary angiography to define his anatomy on 12/14/15 revealing 70% mid and 99% distal LAD stenoses both of which were stented with drug-eluting stents. He feels currently improved. His wife Martin Guzman  says he has more energy and he no longer has anginal chest pain.   Since I saw him 4 years ago he continues to do well.  He denies chest pain or shortness of breath.  He has had syncopal episodes of the last month or 2 occurring primarily in the morning hours.  These occur without warning.  There are no other associated symptoms.   Current Meds  Medication Sig   ALPRAZolam (NIRAVAM) 0.25 MG dissolvable tablet Take 0.25 mg by mouth at bedtime as needed for anxiety.    amitriptyline (ELAVIL) 10 MG tablet 1 tablet   ARIPiprazole (ABILIFY) 5 MG tablet 1 tablet    aspirin 81 MG tablet Take 81 mg by mouth daily.   atorvastatin (LIPITOR) 80 MG tablet Take 1 tablet (80 mg total) by mouth daily at 10 pm.   busPIRone (BUSPAR) 10 MG tablet    Cholecalciferol (VITAMIN D) 2000 units tablet Take 2,000 Units by mouth daily.   clopidogrel (PLAVIX) 75 MG tablet Take 1 tablet (75 mg total) by mouth daily with breakfast.   Fluvoxamine Maleate 150 MG CP24 Take 150 mg by mouth daily.   lisinopril (PRINIVIL,ZESTRIL) 40 MG tablet Take 40 mg by mouth daily.   metoprolol succinate (TOPROL-XL) 50 MG 24 hr tablet Take 50 mg by mouth daily. Take with or immediately following a meal.   nitroGLYCERIN (NITROSTAT) 0.4 MG SL tablet Place 0.4 mg under the tongue every 5 (five) minutes as needed for chest pain.   Ondansetron HCl (ZOFRAN PO) Take by mouth.   testosterone cypionate (DEPOTESTOSTERONE CYPIONATE) 200 MG/ML injection Inject 200 mg into the muscle every 14 (fourteen) days.   Current Facility-Administered Medications for the 08/25/20 encounter (Office Visit) with [name], MD  Medication   0.9 %  sodium chloride infusion     Allergies  Allergen Reactions   Bee Venom Other (See Comments)   Shellfish Allergy Other (See Comments)    Lip numbing.     Social History   Socioeconomic History   Marital status: Married    Spouse name: Not on file   Number of children: 1   Years of education:  Not on file   Highest education level: Not on file  Occupational History   Not on file  Tobacco Use   Smoking status: Never   Smokeless tobacco: Never  Vaping Use   Vaping Use: Never used  Substance and Sexual Activity   Alcohol use: Yes    Comment: 3 drinks per week   Drug use: No   Sexual activity: Not on file  Other Topics Concern   Not on file  Social History Narrative   Not on file   Social Determinants of Health   Financial Resource Strain: Not on file  Food Insecurity: Not on file  Transportation Needs: Not on file  Physical Activity: Not on file   Stress: Not on file  Social Connections: Not on file  Intimate Partner Violence: Not on file     Review of Systems: General: negative for chills, fever, night sweats or weight changes.  Cardiovascular: negative for chest pain, dyspnea on exertion, edema, orthopnea, palpitations, paroxysmal nocturnal dyspnea or shortness of breath Dermatological: negative for rash Respiratory: negative for cough or wheezing Urologic: negative for hematuria Abdominal: negative for nausea, vomiting, diarrhea, bright red blood per rectum, melena, or hematemesis Neurologic: negative for visual changes, syncope, or dizziness All other systems reviewed and are otherwise negative except as noted above.    Blood pressure 114/70, pulse 60, height 6' (1.829 m), weight 272 lb (123.4 kg).  General appearance: alert and no distress Neck: no adenopathy, no carotid bruit, no JVD, supple, symmetrical, trachea midline, and thyroid not enlarged, symmetric, no tenderness/mass/nodules Lungs: clear to auscultation bilaterally Heart: regular rate and rhythm, S1, S2 normal, no murmur, click, rub or gallop Extremities: extremities normal, atraumatic, no cyanosis or edema Pulses: 2+ and symmetric Skin: Skin color, texture, turgor normal. No rashes or lesions Neurologic: Grossly normal  EKG sinus rhythm at 60 without ST or T wave changes.  I personally reviewed this EKG.  ASSESSMENT AND PLAN:   Essential hypertension History of essential pretension blood pressure measured today 114/70.  He is on lisinopril and metoprolol.  Hyperlipidemia with target low density lipoprotein (LDL) cholesterol less than 70 mg/dL History of hyperlipidemia on statin therapy followed by his PCP.  OSA (obstructive sleep apnea) History of obstructive sleep apnea on CPAP.  CAD S/P PCI x 2 to LAD History of CAD status post PCI drug-eluting stenting of his mid and distal LAD by myself 12/14/2015.  He had no other obstructive disease.  He  remains asymptomatic.  Syncope and collapse Mr. [name] has described multiple episodes of syncope more frequent a month ago.  These typically occur in the morning without warning.  It is unclear the etiology.  They do not sound orthostatic.  They could be neurocardiogenic or arrhythmogenic.  I am going to check a 2D echo and a 30-day event monitor to further evaluate.  I have told him, based on [place] law, that he cannot drive for 6 months and less he has a diagnosis that has been addressed.     [name] MD FACP,FACC,FAHA, [facility] 08/25/2020 2:41 PM

## 2020-08-25 NOTE — Assessment & Plan Note (Signed)
Martin Guzman has described multiple episodes of syncope more frequent a month ago.  These typically occur in the morning without warning.  It is unclear the etiology.  They do not sound orthostatic.  They could be neurocardiogenic or arrhythmogenic.  I am going to check a 2D echo and a 30-day event monitor to further evaluate.  I have told him, based on [place] law, that he cannot drive for 6 months and less he has a diagnosis that has been addressed.

## 2020-08-25 NOTE — Patient Instructions (Signed)
Medication Instructions:  The current medical regimen is effective;  continue present plan and medications.  *If you need a refill on your cardiac medications before your next appointment, please call your pharmacy*   Testing/Procedures: Echocardiogram - Your physician has requested that you have an echocardiogram. Echocardiography is a painless test that uses sound waves to create images of your heart. It provides your doctor with information about the size and shape of your heart and how well your heart's chambers and valves are working. This procedure takes approximately one hour. There are no restrictions for this procedure. This will be performed at our [facility] location - [address].  Your physician has recommended that you wear an event monitor. Event monitors are medical devices that record the heart's electrical activity. Doctors most often [place] these monitors to diagnose arrhythmias. Arrhythmias are problems with the speed or rhythm of the heartbeat. The monitor is a small, portable device. You can wear one while you do your normal daily activities. This is usually used to diagnose what is causing palpitations/syncope (passing out).    Follow-Up: At [facility], you and your health needs are our priority.  As part of our continuing mission to provide you with exceptional heart care, we have created designated Provider Care Teams.  These Care Teams include your primary Cardiologist (physician) and Advanced Practice Providers (APPs -  Physician Assistants and Nurse Practitioners) who all work together to provide you with the care you need, when you need it.  We recommend signing up for the patient portal called "MyChart".  Sign up information is provided on this After Visit Summary.  MyChart is used to connect with patients for Virtual Visits (Telemedicine).  Patients are able to view lab/test results, encounter notes, upcoming appointments, etc.  Non-urgent messages can be  sent to your provider as well.   To learn more about what you can do with MyChart, go to [email or web address].    Your next appointment:   6 month(s)  The format for your next appointment:   In Person  Provider:   You will see one of the following Advanced Practice Providers on your designated Care Team:   Sande Rives, PA-C Coletta Memos, FNP  Then, Quay Burow, MD will plan to see you again in 12 month(s).

## 2020-08-25 NOTE — Assessment & Plan Note (Signed)
History of essential pretension blood pressure measured today 114/70.  He is on lisinopril and metoprolol.

## 2020-08-25 NOTE — Assessment & Plan Note (Signed)
History of hyperlipidemia on statin therapy followed by his PCP 

## 2020-08-25 NOTE — Assessment & Plan Note (Signed)
History of CAD status post PCI drug-eluting stenting of his mid and distal LAD by myself 12/14/2015.  He had no other obstructive disease.  He remains asymptomatic.

## 2020-08-25 NOTE — Assessment & Plan Note (Signed)
History of obstructive sleep apnea on CPAP. 

## 2020-08-30 DIAGNOSIS — Z20822 Contact with and (suspected) exposure to covid-19: Secondary | ICD-10-CM | POA: Diagnosis not present

## 2020-08-31 DIAGNOSIS — U071 COVID-19: Secondary | ICD-10-CM | POA: Diagnosis not present

## 2020-09-01 ENCOUNTER — Other Ambulatory Visit: Payer: Self-pay

## 2020-09-01 ENCOUNTER — Ambulatory Visit (INDEPENDENT_AMBULATORY_CARE_PROVIDER_SITE_OTHER): Payer: BLUE CROSS/BLUE SHIELD

## 2020-09-01 DIAGNOSIS — U071 COVID-19: Secondary | ICD-10-CM

## 2020-09-01 MED ORDER — EPINEPHRINE 0.3 MG/0.3ML IJ SOAJ: 0.3000 mg | Freq: Once | INTRAMUSCULAR | Status: AC | PRN

## 2020-09-01 MED ORDER — SODIUM CHLORIDE 0.9 % IV SOLN: INTRAVENOUS | Status: DC | PRN

## 2020-09-01 MED ORDER — DIPHENHYDRAMINE HCL 50 MG/ML IJ SOLN
50.0000 mg | Freq: Once | INTRAMUSCULAR | Status: AC | PRN
Start: 2020-09-01 — End: 2020-09-01

## 2020-09-01 MED ORDER — METHYLPREDNISOLONE SODIUM SUCC 125 MG IJ SOLR: 125.0000 mg | Freq: Once | INTRAMUSCULAR | Status: AC | PRN

## 2020-09-01 MED ORDER — BEBTELOVIMAB 175 MG/2 ML IV (EUA)
175.0000 mg | Freq: Once | INTRAMUSCULAR | Status: AC
  Administered 2020-09-01: 175 mg via INTRAVENOUS

## 2020-09-01 MED ORDER — FAMOTIDINE IN NACL 20-0.9 MG/50ML-% IV SOLN: 20.0000 mg | Freq: Once | INTRAVENOUS | Status: AC | PRN

## 2020-09-01 MED ORDER — ALBUTEROL SULFATE HFA 108 (90 BASE) MCG/ACT IN AERS: 2.0000 | INHALATION_SPRAY | Freq: Once | RESPIRATORY_TRACT | Status: AC | PRN

## 2020-09-01 NOTE — Progress Notes (Addendum)
Diagnosis: COVID  Provider:  Marshell Garfinkel, MD  Procedure: Infusion  IV Type: Peripheral, IV Location: L Hand  Bebtelovimab, Dose: 175 mg  Infusion Start Time: 13.50 09/01/2020  Infusion Stop Time: 13.51 09/01/2020  Post Infusion IV Care: Observation period completed and Peripheral IV Discontinued  Discharge: Condition: Good, Destination: Home . AVS provided to patient.   Performed by:  Arnoldo Morale, RN

## 2020-09-01 NOTE — Patient Instructions (Signed)
10 Things You Can Do to Manage Your COVID-19 Symptoms at Home If you have possible or confirmed COVID-19 Stay home except to get medical care. Monitor your symptoms carefully. If your symptoms get worse, call your healthcare provider immediately. Get rest and stay hydrated. If you have a medical appointment, call the healthcare provider ahead of time and tell them that you have or may have COVID-19. For medical emergencies, call 911 and notify the dispatch personnel that you have or may have COVID-19. Cover your cough and sneezes with a tissue or use the inside of your elbow. Wash your hands often with soap and water for at least 20 seconds or clean your hands with an alcohol-based hand sanitizer that contains at least 60% alcohol. As much as possible, stay in a specific room and away from other people in your home. Also, you should use a separate bathroom, if available. If you need to be around other people in or outside of the home, wear a mask. Avoid sharing personal items with other people in your household, like dishes, towels, and bedding. Clean all surfaces that are touched often, like counters, tabletops, and doorknobs. Use household cleaning sprays or wipes according to the label instructions. [email or web address] 07/19/2019 This information is not intended to replace advice given to you by your health care provider. Make sure you discuss any questions you have with your health care provider. Patient was given drug information sheet for Bebtelovimab.  Also given cost estimate sheet for Bebtelovimab.  Patient reviewed documentation and questions were answered.  Patient would like to proceed with treatment at this time.   Document Revised: 05/07/2020 Document Reviewed: 05/07/2020 Elsevier Patient Education  [place].

## 2020-09-09 ENCOUNTER — Ambulatory Visit (INDEPENDENT_AMBULATORY_CARE_PROVIDER_SITE_OTHER): Payer: BLUE CROSS/BLUE SHIELD

## 2020-09-09 DIAGNOSIS — R55 Syncope and collapse: Secondary | ICD-10-CM

## 2020-09-10 DIAGNOSIS — R55 Syncope and collapse: Secondary | ICD-10-CM | POA: Diagnosis not present

## 2020-09-14 ENCOUNTER — Other Ambulatory Visit: Payer: Self-pay

## 2020-09-14 ENCOUNTER — Ambulatory Visit (HOSPITAL_COMMUNITY): Payer: BLUE CROSS/BLUE SHIELD | Attending: Cardiology

## 2020-09-14 DIAGNOSIS — R55 Syncope and collapse: Secondary | ICD-10-CM | POA: Diagnosis not present

## 2020-09-14 LAB — ECHOCARDIOGRAM COMPLETE
Area-P 1/2: 3.56 cm2
S' Lateral: 3.5 cm

## 2020-09-17 DIAGNOSIS — G43111 Migraine with aura, intractable, with status migrainosus: Secondary | ICD-10-CM | POA: Diagnosis not present

## 2020-09-28 DIAGNOSIS — M9904 Segmental and somatic dysfunction of sacral region: Secondary | ICD-10-CM | POA: Diagnosis not present

## 2020-09-28 DIAGNOSIS — M9903 Segmental and somatic dysfunction of lumbar region: Secondary | ICD-10-CM | POA: Diagnosis not present

## 2020-09-28 DIAGNOSIS — M545 Low back pain, unspecified: Secondary | ICD-10-CM | POA: Diagnosis not present

## 2020-09-28 DIAGNOSIS — M9905 Segmental and somatic dysfunction of pelvic region: Secondary | ICD-10-CM | POA: Diagnosis not present

## 2020-09-29 DIAGNOSIS — M545 Low back pain, unspecified: Secondary | ICD-10-CM | POA: Diagnosis not present

## 2020-09-29 DIAGNOSIS — M9905 Segmental and somatic dysfunction of pelvic region: Secondary | ICD-10-CM | POA: Diagnosis not present

## 2020-09-29 DIAGNOSIS — M9903 Segmental and somatic dysfunction of lumbar region: Secondary | ICD-10-CM | POA: Diagnosis not present

## 2020-09-29 DIAGNOSIS — M9904 Segmental and somatic dysfunction of sacral region: Secondary | ICD-10-CM | POA: Diagnosis not present

## 2020-09-30 DIAGNOSIS — M545 Low back pain, unspecified: Secondary | ICD-10-CM | POA: Diagnosis not present

## 2020-09-30 DIAGNOSIS — M9903 Segmental and somatic dysfunction of lumbar region: Secondary | ICD-10-CM | POA: Diagnosis not present

## 2020-09-30 DIAGNOSIS — M9905 Segmental and somatic dysfunction of pelvic region: Secondary | ICD-10-CM | POA: Diagnosis not present

## 2020-09-30 DIAGNOSIS — M9904 Segmental and somatic dysfunction of sacral region: Secondary | ICD-10-CM | POA: Diagnosis not present

## 2020-10-01 DIAGNOSIS — M9904 Segmental and somatic dysfunction of sacral region: Secondary | ICD-10-CM | POA: Diagnosis not present

## 2020-10-01 DIAGNOSIS — M545 Low back pain, unspecified: Secondary | ICD-10-CM | POA: Diagnosis not present

## 2020-10-01 DIAGNOSIS — M9905 Segmental and somatic dysfunction of pelvic region: Secondary | ICD-10-CM | POA: Diagnosis not present

## 2020-10-01 DIAGNOSIS — M9903 Segmental and somatic dysfunction of lumbar region: Secondary | ICD-10-CM | POA: Diagnosis not present

## 2020-10-05 DIAGNOSIS — M9904 Segmental and somatic dysfunction of sacral region: Secondary | ICD-10-CM | POA: Diagnosis not present

## 2020-10-05 DIAGNOSIS — M9905 Segmental and somatic dysfunction of pelvic region: Secondary | ICD-10-CM | POA: Diagnosis not present

## 2020-10-05 DIAGNOSIS — M545 Low back pain, unspecified: Secondary | ICD-10-CM | POA: Diagnosis not present

## 2020-10-05 DIAGNOSIS — M9903 Segmental and somatic dysfunction of lumbar region: Secondary | ICD-10-CM | POA: Diagnosis not present

## 2020-10-07 DIAGNOSIS — M545 Low back pain, unspecified: Secondary | ICD-10-CM | POA: Diagnosis not present

## 2020-10-07 DIAGNOSIS — M9904 Segmental and somatic dysfunction of sacral region: Secondary | ICD-10-CM | POA: Diagnosis not present

## 2020-10-07 DIAGNOSIS — M9903 Segmental and somatic dysfunction of lumbar region: Secondary | ICD-10-CM | POA: Diagnosis not present

## 2020-10-07 DIAGNOSIS — M9905 Segmental and somatic dysfunction of pelvic region: Secondary | ICD-10-CM | POA: Diagnosis not present

## 2020-10-08 DIAGNOSIS — M9905 Segmental and somatic dysfunction of pelvic region: Secondary | ICD-10-CM | POA: Diagnosis not present

## 2020-10-08 DIAGNOSIS — M545 Low back pain, unspecified: Secondary | ICD-10-CM | POA: Diagnosis not present

## 2020-10-08 DIAGNOSIS — M9903 Segmental and somatic dysfunction of lumbar region: Secondary | ICD-10-CM | POA: Diagnosis not present

## 2020-10-08 DIAGNOSIS — M9904 Segmental and somatic dysfunction of sacral region: Secondary | ICD-10-CM | POA: Diagnosis not present

## 2020-10-12 DIAGNOSIS — M9903 Segmental and somatic dysfunction of lumbar region: Secondary | ICD-10-CM | POA: Diagnosis not present

## 2020-10-12 DIAGNOSIS — M9904 Segmental and somatic dysfunction of sacral region: Secondary | ICD-10-CM | POA: Diagnosis not present

## 2020-10-12 DIAGNOSIS — M9905 Segmental and somatic dysfunction of pelvic region: Secondary | ICD-10-CM | POA: Diagnosis not present

## 2020-10-12 DIAGNOSIS — M545 Low back pain, unspecified: Secondary | ICD-10-CM | POA: Diagnosis not present

## 2020-12-21 DIAGNOSIS — S46012A Strain of muscle(s) and tendon(s) of the rotator cuff of left shoulder, initial encounter: Secondary | ICD-10-CM | POA: Diagnosis not present

## 2021-01-06 DIAGNOSIS — S46012A Strain of muscle(s) and tendon(s) of the rotator cuff of left shoulder, initial encounter: Secondary | ICD-10-CM | POA: Diagnosis not present

## 2021-01-07 DIAGNOSIS — M25512 Pain in left shoulder: Secondary | ICD-10-CM | POA: Diagnosis not present

## 2021-01-20 DIAGNOSIS — F331 Major depressive disorder, recurrent, moderate: Secondary | ICD-10-CM | POA: Diagnosis not present

## 2021-01-20 DIAGNOSIS — F419 Anxiety disorder, unspecified: Secondary | ICD-10-CM | POA: Diagnosis not present

## 2021-02-01 DIAGNOSIS — M19012 Primary osteoarthritis, left shoulder: Secondary | ICD-10-CM | POA: Diagnosis not present

## 2021-02-02 ENCOUNTER — Telehealth: Payer: Self-pay

## 2021-02-02 NOTE — Telephone Encounter (Signed)
° °  Pre-operative Risk Assessment    Patient Name: Martin Guzman  DOB: 15-Sep-1965 MRN: [id number]      Request for Surgical Clearance    Procedure:   Left total shoulder arthroplasty   Date of Surgery:  Clearance TBD                                 Surgeon:  Raliegh Ip Orthopedic Surgeon's Group or Practice Name:  Dr.Timothy Percell Miller Phone number:  [phone] Fax number:  [phone]   Type of Clearance Requested:   - Medical  - Pharmacy:  Hold Clopidogrel (Plavix)     Type of Anesthesia:  Not Indicated   Additional requests/questions:  Please advise surgeon/provider what medications should be held. Please fax a copy of clearance to the surgeon's office.  [name]   02/02/2021, 4:07 PM

## 2021-02-03 ENCOUNTER — Encounter: Payer: Self-pay | Admitting: Cardiovascular Disease

## 2021-02-03 ENCOUNTER — Ambulatory Visit (INDEPENDENT_AMBULATORY_CARE_PROVIDER_SITE_OTHER): Payer: BC Managed Care – PPO | Admitting: Cardiovascular Disease

## 2021-02-03 ENCOUNTER — Other Ambulatory Visit: Payer: Self-pay

## 2021-02-03 DIAGNOSIS — I10 Essential (primary) hypertension: Secondary | ICD-10-CM

## 2021-02-03 DIAGNOSIS — G4733 Obstructive sleep apnea (adult) (pediatric): Secondary | ICD-10-CM | POA: Diagnosis not present

## 2021-02-03 DIAGNOSIS — E785 Hyperlipidemia, unspecified: Secondary | ICD-10-CM

## 2021-02-03 DIAGNOSIS — Z01810 Encounter for preprocedural cardiovascular examination: Secondary | ICD-10-CM

## 2021-02-03 DIAGNOSIS — Z9861 Coronary angioplasty status: Secondary | ICD-10-CM

## 2021-02-03 DIAGNOSIS — I2510 Atherosclerotic heart disease of native coronary artery without angina pectoris: Secondary | ICD-10-CM

## 2021-02-03 DIAGNOSIS — Z01818 Encounter for other preprocedural examination: Secondary | ICD-10-CM

## 2021-02-03 DIAGNOSIS — R55 Syncope and collapse: Secondary | ICD-10-CM

## 2021-02-03 NOTE — Telephone Encounter (Signed)
Pt seen in clinic today. Per Dr. Gwenlyn Found pt is cleared at low cardiac risk. Pt was instructed to hold plavix for 5 days prior to surgery and to restart plavix per Dr. Mardelle Matte. Pt is aware of these recommendations and verbalizes understanding. Pre-op paperwork filled out and signed by Dr. Gwenlyn Found and returned to the pt.

## 2021-02-03 NOTE — Assessment & Plan Note (Signed)
History of obstructive sleep apnea on CPAP. 

## 2021-02-03 NOTE — Assessment & Plan Note (Signed)
History of syncope in the early a.m. hours which she related to me 6 months ago.  I did do a 2D echo which was entirely normal as was an event monitor.  He said no further symptoms.

## 2021-02-03 NOTE — Patient Instructions (Signed)
Medication Instructions:  Your physician recommends that you continue on your current medications as directed. Please refer to the Current Medication list given to you today.  *If you need a refill on your cardiac medications before your next appointment, please call your pharmacy*   Lab Work: Your physician recommends that you return for lab work in: the next week or 2 for FASTING lipid/liver profile  If you have labs (blood work) drawn today and your tests are completely normal, you will receive your results only by: MyChart Message (if you have MyChart) OR A paper copy in the mail If you have any lab test that is abnormal or we need to change your treatment, we will call you to review the results.   Follow-Up: At [facility], you and your health needs are our priority.  As part of our continuing mission to provide you with exceptional heart care, we have created designated Provider Care Teams.  These Care Teams include your primary Cardiologist (physician) and Advanced Practice Providers (APPs -  Physician Assistants and Nurse Practitioners) who all work together to provide you with the care you need, when you need it.  We recommend signing up for the patient portal called "MyChart".  Sign up information is provided on this After Visit Summary.  MyChart is used to connect with patients for Virtual Visits (Telemedicine).  Patients are able to view lab/test results, encounter notes, upcoming appointments, etc.  Non-urgent messages can be sent to your provider as well.   To learn more about what you can do with MyChart, go to [email or web address].    Your next appointment:   12 month(s)  The format for your next appointment:   In Person  Provider:   Jonathan Berry, MD  

## 2021-02-03 NOTE — Progress Notes (Signed)
02/03/2021 Martin Guzman   [birth year]  [id number]  Primary Physician [name], FNP Primary Cardiologist: [name] MD [name]  HPI:  Martin Guzman is a 56 y.o.    mildly overweight married Caucasian male father of one 85 year old daughter . I last saw him in the office 08/25/2020. He was referred by [name] as well as his PCP, [name] for cardiovascular evaluation because of new-onset chest pain. His risk factors include treated hypertension and hyperlipidemia untreated according to the chart. He does have obstructive sleep apnea on sleep. He does not smoke nor is he diabetic. He has a strong family history for heart disease with a mother who has apparently 56 stents by his account and a brother who had stenting at age 52. New-onset chest pain approximately 4 weeks ago has had 6 episodes since. Currently during times of stress and exercise with left upper extremity radiation. He did have a routine GXT at the hospital by [name] on 11/2015 which was nonischemic although he developed substernal chest pain relieved with sublingual nitroglycerin. I elected to proceed with outpatient radial diagnostic coronary angiography to define his anatomy on 12/14/15 revealing 70% mid and 99% distal LAD stenoses both of which were stented with drug-eluting stents. He feels currently improved. His wife Martin Guzman  says he has more energy and he no longer has anginal chest pain.    Since I saw him 6 months ago he continues to do well.  He was complaining of several syncopal episodes in the early a.m. hours when I saw him last.  A 2D echo was ordered that was entirely normal as well as an event monitor.  He said no further symptoms.  He denies chest pain or shortness of breath.  He apparently injured his shoulder at work and needs surgery by Dr. Mardelle Matte in the upcoming future which I will clear him for at low risk.     Current Meds  Medication Sig   ALPRAZolam (NIRAVAM) 0.25  MG dissolvable tablet Take 0.25 mg by mouth at bedtime as needed for anxiety.    amitriptyline (ELAVIL) 10 MG tablet 1 tablet   ARIPiprazole (ABILIFY) 5 MG tablet 1 tablet   aspirin 81 MG tablet Take 81 mg by mouth daily.   atorvastatin (LIPITOR) 80 MG tablet Take 1 tablet (80 mg total) by mouth daily at 10 pm.   busPIRone (BUSPAR) 15 MG tablet Take 15 mg by mouth 2 (two) times daily.   Cholecalciferol (VITAMIN D) 2000 units tablet Take 2,000 Units by mouth daily.   clopidogrel (PLAVIX) 75 MG tablet Take 1 tablet (75 mg total) by mouth daily with breakfast.   Fluvoxamine Maleate 150 MG CP24 Take 150 mg by mouth daily.   lisinopril (PRINIVIL,ZESTRIL) 40 MG tablet Take 40 mg by mouth daily.   metoprolol succinate (TOPROL-XL) 50 MG 24 hr tablet Take 50 mg by mouth daily. Take with or immediately following a meal.   nitroGLYCERIN (NITROSTAT) 0.4 MG SL tablet Place 0.4 mg under the tongue every 5 (five) minutes as needed for chest pain.   Ondansetron HCl (ZOFRAN PO) Take by mouth.   testosterone cypionate (DEPOTESTOSTERONE CYPIONATE) 200 MG/ML injection Inject 200 mg into the muscle every 14 (fourteen) days.   [DISCONTINUED] busPIRone (BUSPAR) 10 MG tablet    Current Facility-Administered Medications for the 02/03/21 encounter (Office Visit) with [name], MD  Medication   0.9 %  sodium chloride infusion  0.9 %  sodium chloride infusion     Allergies  Allergen Reactions   Bee Venom Other (See Comments)   Shellfish Allergy Other (See Comments)    Lip numbing.     Social History   Socioeconomic History   Marital status: Married    Spouse name: Not on file   Number of children: 1   Years of education: Not on file   Highest education level: Not on file  Occupational History   Not on file  Tobacco Use   Smoking status: Never   Smokeless tobacco: Never  Vaping Use   Vaping Use: Never used  Substance and Sexual Activity   Alcohol use: Yes    Comment: 3 drinks per week    Drug use: No   Sexual activity: Not on file  Other Topics Concern   Not on file  Social History Narrative   Not on file   Social Determinants of Health   Financial Resource Strain: Not on file  Food Insecurity: Not on file  Transportation Needs: Not on file  Physical Activity: Not on file  Stress: Not on file  Social Connections: Not on file  Intimate Partner Violence: Not on file     Review of Systems: General: negative for chills, fever, night sweats or weight changes.  Cardiovascular: negative for chest pain, dyspnea on exertion, edema, orthopnea, palpitations, paroxysmal nocturnal dyspnea or shortness of breath Dermatological: negative for rash Respiratory: negative for cough or wheezing Urologic: negative for hematuria Abdominal: negative for nausea, vomiting, diarrhea, bright red blood per rectum, melena, or hematemesis Neurologic: negative for visual changes, syncope, or dizziness All other systems reviewed and are otherwise negative except as noted above.    Blood pressure 128/82, pulse 85, height 6' (1.829 m), weight 273 lb 3.2 oz (123.9 kg), SpO2 99 %.  General appearance: alert and no distress Neck: no adenopathy, no carotid bruit, no JVD, supple, symmetrical, trachea midline, and thyroid not enlarged, symmetric, no tenderness/mass/nodules Lungs: clear to auscultation bilaterally Heart: regular rate and rhythm, S1, S2 normal, no murmur, click, rub or gallop Extremities: extremities normal, atraumatic, no cyanosis or edema Pulses: 2+ and symmetric Skin: Skin color, texture, turgor normal. No rashes or lesions Neurologic: Grossly normal  EKG sinus rhythm at 85 with low limb voltage.  I personally reviewed this EKG.  ASSESSMENT AND PLAN:   Essential hypertension History of essential hypertension her blood pressure measured today at 128/82.  He is on lisinopril and metoprolol.  Hyperlipidemia with target low density lipoprotein (LDL) cholesterol less than 70  mg/dL History of hyperlipidemia on high-dose statin therapy followed by his PCP.  We will obtain a fasting lipid liver profile.  OSA (obstructive sleep apnea) History of obstructive sleep apnea on CPAP  CAD S/P PCI x 2 to LAD History of CAD status post proximal-mid LAD PCI and drug-eluting stenting by myself via the radial approach 11/17.  His symptoms completely resolved after that.  He currently denies chest pain or shortness of breath.  He remains on dual antiplatelet therapy.  Syncope and collapse History of syncope in the early a.m. hours which she related to me 6 months ago.  I did do a 2D echo which was entirely normal as was an event monitor.  He said no further symptoms.  Preoperative clearance Patient is scheduled to have his left shoulder repaired by Dr. Mardelle Matte in the near future.  It is a result of a injury incurred during work and therefore is a "work [employer], community  related procedure.  He recently had a normal 2D echo.  He has been stable from a cardiac point of view.  He is cleared at low risk.  He can stop his Plavix 5 days before the procedure.     [name] MD FACP,FACC,FAHA, [facility] 02/03/2021 9:21 AM

## 2021-02-03 NOTE — Assessment & Plan Note (Signed)
Patient is scheduled to have his left shoulder repaired by Dr. Mardelle Matte in the near future.  It is a result of a injury incurred during work and therefore is a "work [employer]" related procedure.  He recently had a normal 2D echo.  He has been stable from a cardiac point of view.  He is cleared at low risk.  He can stop his Plavix 5 days before the procedure.

## 2021-02-03 NOTE — Assessment & Plan Note (Signed)
History of CAD status post proximal-mid LAD PCI and drug-eluting stenting by myself via the radial approach 11/17.  His symptoms completely resolved after that.  He currently denies chest pain or shortness of breath.  He remains on dual antiplatelet therapy.

## 2021-02-03 NOTE — Assessment & Plan Note (Signed)
History of essential hypertension her blood pressure measured today at 128/82.  He is on lisinopril and metoprolol.

## 2021-02-03 NOTE — Assessment & Plan Note (Signed)
History of hyperlipidemia on high-dose statin therapy followed by his PCP.  We will obtain a fasting lipid liver profile.

## 2021-02-04 NOTE — Telephone Encounter (Signed)
° ° °  Patient Name: Martin Guzman  DOB: 26-Feb-1965 MRN: [id number]  Primary Cardiologist: Quay Burow, MD  Chart reviewed as part of pre-operative protocol coverage. Given past medical history and time since last visit, based on ACC/AHA guidelines, [place] would be at acceptable risk for the planned procedure without further cardiovascular testing.   Patient was cleared by Dr. Gwenlyn Found during office visit.  He may hold the Plavix for 5 days prior to the surgery and restart as soon as possible afterward at the surgeon's discretion.  The patient was advised that if he develops new symptoms prior to surgery to contact our office to arrange for a follow-up visit, and he verbalized understanding.  I will route this recommendation to the requesting party via Epic fax function and remove from pre-op pool.  Please call with questions.  [place], [place] 02/04/2021, 3:19 PM

## 2021-02-08 DIAGNOSIS — E785 Hyperlipidemia, unspecified: Secondary | ICD-10-CM | POA: Diagnosis not present

## 2021-02-08 LAB — HEPATIC FUNCTION PANEL
ALT: 29 IU/L (ref 0–44)
AST: 18 IU/L (ref 0–40)
Albumin: 4.6 g/dL (ref 3.8–4.9)
Alkaline Phosphatase: 98 IU/L (ref 44–121)
Bilirubin Total: 0.8 mg/dL (ref 0.0–1.2)
Bilirubin, Direct: 0.18 mg/dL (ref 0.00–0.40)
Total Protein: 6.9 g/dL (ref 6.0–8.5)

## 2021-02-08 LAB — LIPID PANEL
Chol/HDL Ratio: 3.9 ratio (ref 0.0–5.0)
Cholesterol, Total: 213 mg/dL — ABNORMAL HIGH (ref 100–199)
HDL: 54 mg/dL (ref >39)
LDL Chol Calc (NIH): 141 mg/dL — ABNORMAL HIGH (ref 0–99)
Triglycerides: 100 mg/dL (ref 0–149)
VLDL Cholesterol Cal: 18 mg/dL (ref 5–40)

## 2021-02-11 ENCOUNTER — Other Ambulatory Visit: Payer: Self-pay | Admitting: *Deleted

## 2021-02-11 DIAGNOSIS — E785 Hyperlipidemia, unspecified: Secondary | ICD-10-CM

## 2021-02-22 DIAGNOSIS — Z01818 Encounter for other preprocedural examination: Secondary | ICD-10-CM | POA: Diagnosis not present

## 2021-02-22 DIAGNOSIS — M19012 Primary osteoarthritis, left shoulder: Secondary | ICD-10-CM | POA: Diagnosis not present

## 2021-02-22 DIAGNOSIS — E782 Mixed hyperlipidemia: Secondary | ICD-10-CM | POA: Diagnosis not present

## 2021-02-22 DIAGNOSIS — E291 Testicular hypofunction: Secondary | ICD-10-CM | POA: Diagnosis not present

## 2021-02-22 DIAGNOSIS — R7303 Prediabetes: Secondary | ICD-10-CM | POA: Diagnosis not present

## 2021-02-22 DIAGNOSIS — F3341 Major depressive disorder, recurrent, in partial remission: Secondary | ICD-10-CM | POA: Diagnosis not present

## 2021-02-22 DIAGNOSIS — I10 Essential (primary) hypertension: Secondary | ICD-10-CM | POA: Diagnosis not present

## 2021-03-05 ENCOUNTER — Ambulatory Visit: Payer: BC Managed Care – PPO

## 2021-03-10 ENCOUNTER — Ambulatory Visit (INDEPENDENT_AMBULATORY_CARE_PROVIDER_SITE_OTHER): Payer: BC Managed Care – PPO | Admitting: Pharmacist

## 2021-03-10 ENCOUNTER — Other Ambulatory Visit: Payer: Self-pay

## 2021-03-10 ENCOUNTER — Telehealth: Payer: Self-pay | Admitting: Pharmacist

## 2021-03-10 ENCOUNTER — Encounter: Payer: Self-pay | Admitting: Pharmacist

## 2021-03-10 DIAGNOSIS — I2510 Atherosclerotic heart disease of native coronary artery without angina pectoris: Secondary | ICD-10-CM

## 2021-03-10 DIAGNOSIS — E782 Mixed hyperlipidemia: Secondary | ICD-10-CM

## 2021-03-10 DIAGNOSIS — G4710 Hypersomnia, unspecified: Secondary | ICD-10-CM | POA: Insufficient documentation

## 2021-03-10 DIAGNOSIS — E559 Vitamin D deficiency, unspecified: Secondary | ICD-10-CM | POA: Insufficient documentation

## 2021-03-10 DIAGNOSIS — Z6837 Body mass index (BMI) 37.0-37.9, adult: Secondary | ICD-10-CM | POA: Insufficient documentation

## 2021-03-10 DIAGNOSIS — Z9861 Coronary angioplasty status: Secondary | ICD-10-CM | POA: Diagnosis not present

## 2021-03-10 DIAGNOSIS — G43919 Migraine, unspecified, intractable, without status migrainosus: Secondary | ICD-10-CM | POA: Insufficient documentation

## 2021-03-10 DIAGNOSIS — G2581 Restless legs syndrome: Secondary | ICD-10-CM | POA: Insufficient documentation

## 2021-03-10 MED ORDER — REPATHA SURECLICK 140 MG/ML ~~LOC~~ SOAJ: 140.0000 mg | SUBCUTANEOUS | 11 refills | Status: DC

## 2021-03-10 NOTE — Patient Instructions (Addendum)
It was nice meeting you today ? ?We would like your LDL (bad cholesterol) to be less than 70 ? ?Continue your atorvastatin '80mg'$  once a day ? ?We will start you on a new medication called Repatha or Praluent ? ?You will use one pen every 2 weeks ? ?I will complete the prior authorization for you and contact you when it is approved ? ?Once you start the medication we will recheck your cholesterol in 2-3 months ? ?Please call with any questions! ? ?Karren Cobble, PharmD, BCACP, [place], CPP ?[address], [address] ?[place], [place], [address] ?Phone: [phone], Fax: [phone]  ?

## 2021-03-10 NOTE — Progress Notes (Signed)
Patient ID: Martin Guzman                 DOB: December 03, 1965                    MRN: [id number] ? ? ? ? ?HPI: ?Martin Guzman is a 56 y.o. male patient referred to lipid clinic by Dr Gwenlyn Found. PMH is significant for CAD, HTN, angina, OSA, and depressive disorder. ? ?Patient presents today in good spirits. Has been on atorvastatin '80mg'$  and tolerating without adverse effects. Takes medications for his depression/anxiety which may be contributing to his hyperlipidemia.   ? ?Is familiar with self injections since his wife gives him testosterone and he has an epi pen at home. Been married for 22 years. 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   ? ?Self reports a poor diet with a lot of snacking.  Does not drink much alcohol but does use a vaping device. ? ?Current Medications:  ?Atorvastatin '80mg'$  daily ? ?Intolerances: N/A ? ?Risk Factors:  ?CAD ?HLD ? ?LDL goal: <70 ? ?Labs: TC 213, LDL 141, Trigs 100, HDL 54 (02/08/21 on atorvastatin 80) ? ?Past Medical History:  ?Diagnosis Date  ? Anxiety   ? Anxiety and depression   ? Arthritis   ? CAD S/P PCI x 2 to LAD 12/14/2015  ? Mid LAD lesion, 70 %stenosed. --> (Resolute Onyx 3.0 x15), Dist LAD lesion, 99 %stenosed --> (Resolute Onyx 2.25x15)   ? Coronary artery disease   ? Depression   ? GERD (gastroesophageal reflux disease)   ? Headache   ? Hypertension   ? Laceration of leg not thigh   ? Migraine   ? Progressive angina ([place]) 12/14/2015  ? Cardiac Cath with m & d LAD lesions - PCI x 2 DES stents  ? ? ?Current Outpatient Medications on File Prior to Visit  ?Medication Sig Dispense Refill  ? famotidine (PEPCID AC MAXIMUM STRENGTH) 20 MG tablet 1 tablet at bedtime as needed    ? OLANZapine (ZYPREXA) 5 MG tablet Take 0.5 tablets by mouth at bedtime.    ? promethazine (PHENERGAN) 25 MG tablet 1 tablet as needed    ? ALPRAZolam (NIRAVAM) 0.25 MG dissolvable tablet Take 0.25 mg by mouth at bedtime as needed for anxiety.     ? amitriptyline (ELAVIL) 10 MG tablet 1 tablet    ?  ARIPiprazole (ABILIFY) 5 MG tablet 1 tablet    ? aspirin (V-R ASPIRIN EC LOW ST) 81 MG EC tablet 1 tablet    ? aspirin 81 MG tablet Take 81 mg by mouth daily.    ? atorvastatin (LIPITOR) 80 MG tablet Take 1 tablet (80 mg total) by mouth daily at 10 pm. 30 tablet 12  ? busPIRone (BUSPAR) 15 MG tablet Take 15 mg by mouth 2 (two) times daily.    ? Cholecalciferol (VITAMIN D) 2000 units tablet Take 2,000 Units by mouth daily.    ? clopidogrel (PLAVIX) 75 MG tablet Take 1 tablet (75 mg total) by mouth daily with breakfast. 30 tablet 12  ? Fluvoxamine Maleate 150 MG CP24 Take 150 mg by mouth daily.    ? hydrochlorothiazide (HYDRODIURIL) 25 MG tablet Take by mouth.    ? lisinopril (PRINIVIL,ZESTRIL) 40 MG tablet Take 40 mg by mouth daily.    ? metoprolol succinate (TOPROL-XL) 50 MG 24 hr tablet Take 50 mg by mouth daily. Take with or immediately following a meal.    ? metoprolol succinate (TOPROL-XL) 50  MG 24 hr tablet 1 tablet    ? nitroGLYCERIN (NITROSTAT) 0.4 MG SL tablet Place 0.4 mg under the tongue every 5 (five) minutes as needed for chest pain.    ? Ondansetron HCl (ZOFRAN PO) Take by mouth.    ? predniSONE (STERAPRED UNI-PAK 21 TAB) 10 MG (21) TBPK tablet     ? SUMAtriptan (IMITREX) 100 MG tablet 1/2 to 1 tablet    ? testosterone cypionate (DEPOTESTOSTERONE CYPIONATE) 200 MG/ML injection Inject 200 mg into the muscle every 14 (fourteen) days.    ? ?No current facility-administered medications on file prior to visit.  ? ? ?Allergies  ?Allergen Reactions  ? Bee Venom Other (See Comments)  ? Shellfish Allergy Other (See Comments)  ?  Lip numbing.   ? ? ?Assessment/Plan: ? ?1. Hyperlipidemia - Patient LDL 141 which is above goal of <70 despite high intensity statin.  Will started PCSK9i.  Using demo pen, educated patient on mechanism of action, storage, site selection, administration and possible adverse effects.  Patient voiced understanding. Will complete prior authorization and activate copay card.  Recheck lipid  panel in 2-3 months. ? ?Continue atorvastatin '80mg'$  daily ?Start Repatha/Praluent sq q 14 days ?Recheck lipid panel in 2-3 weeks ? ?Karren Cobble, PharmD, BCACP, [place], CPP ?[address], [address] ?[place], [place], [address] ?Phone: [phone], Fax: [phone]  ?  ?

## 2021-03-10 NOTE — Telephone Encounter (Signed)
Please complete prior authorization for: ? ?Name of medication, dose, and frequency repatha '140mg'$  sq q 14 days or Praluent '75mg'$  sq q 14 days ? ?Lab Orders Requested? yes ? ?Which labs? Lipid panel ? ?Estimated date for labs to be scheduled 2-3 months ? ?Does patient need activated copay card? yes  ?

## 2021-03-10 NOTE — Telephone Encounter (Signed)
Called and spoke with pt and stated that they were approved for repatha '140mg'$  q2w, rx sent, pt instructed to complete fasting labs post 4th dose and to call if the pharmacy has issues running the copay card and the pt voiced understanding.  ? ?COPAY CARD: ?RxBin: [id number] RxPCN: [id number] RxGrp: [id number] ID: [id number] ? ?LIPID PANEL ORDERED AND RELEASED ?

## 2021-03-25 ENCOUNTER — Telehealth (INDEPENDENT_AMBULATORY_CARE_PROVIDER_SITE_OTHER): Payer: BLUE CROSS/BLUE SHIELD | Admitting: Adult Health

## 2021-03-25 ENCOUNTER — Encounter: Payer: Self-pay | Admitting: *Deleted

## 2021-03-25 DIAGNOSIS — G4719 Other hypersomnia: Secondary | ICD-10-CM

## 2021-03-25 DIAGNOSIS — Z9989 Dependence on other enabling machines and devices: Secondary | ICD-10-CM

## 2021-03-25 DIAGNOSIS — G4733 Obstructive sleep apnea (adult) (pediatric): Secondary | ICD-10-CM

## 2021-03-25 NOTE — Progress Notes (Signed)
Order for CPAP supplies faxed back to [facility] along with today's visit note and insurance/demographics information for processing. Received a receipt of confirmation. ? ?

## 2021-03-25 NOTE — Telephone Encounter (Signed)
Received a CPAP supply order form via fax from [facility]. Pt hasn't been seen since 02/03/20. I called the pt and was able to schedule him for a VV on mychart with MM NP today at 11 AM, join at 1045. PAP data available on [facility] site. Pt was able to successfully log into his mychart account and was advised how to join the visit. ESS questionnaire was sent to patient to complete for the visit. His questions were answered during the call and he verbalized appreciation.  ?

## 2021-03-25 NOTE — Progress Notes (Signed)
? ? ? ?PATIENT: Martin Guzman ?DOB: 1965/08/15 ? ?REASON FOR VISIT: follow up ?HISTORY FROM: patient ?PRIMARY NEUROLOGIST:  ? ?Virtual Visit via Video Note ? ?I connected with Martin Guzman on 03/25/21 at 11:00 AM EDT by a video enabled telemedicine application located remotely at [facility] and verified that I am speaking with the correct person using two identifiers who was located at their own home. ?  ?I discussed the limitations of evaluation and management by telemedicine and the availability of in person appointments. The patient expressed understanding and agreed to proceed. ? ? ?PATIENT: Martin Guzman ?DOB: [birth year] ? ?REASON FOR VISIT: follow up ?HISTORY FROM: patient ? ?HISTORY OF PRESENT ILLNESS: ?Today 03/25/21: ? ?Martin Guzman is a 56 year old male with a history of obstructive sleep apnea on CPAP.  He returns today for follow-up.  Reorts that the CPAP is working well for him.  Denies any new issues ? ? ? ?REVIEW OF SYSTEMS: Out of a complete 14 system review of symptoms, the patient complains only of the following symptoms, and all other reviewed systems are negative. ? ?ESS 11 ? ?ALLERGIES: ?Allergies  ?Allergen Reactions  ? Bee Venom Other (See Comments)  ? Shellfish Allergy Other (See Comments)  ?  Lip numbing.   ? ? ?HOME MEDICATIONS: ?Outpatient Medications Prior to Visit  ?Medication Sig Dispense Refill  ? ALPRAZolam (NIRAVAM) 0.25 MG dissolvable tablet Take 0.25 mg by mouth at bedtime as needed for anxiety.     ? amitriptyline (ELAVIL) 10 MG tablet 1 tablet    ? ARIPiprazole (ABILIFY) 5 MG tablet 1 tablet    ? aspirin 81 MG tablet Take 81 mg by mouth daily.    ? atorvastatin (LIPITOR) 80 MG tablet Take 1 tablet (80 mg total) by mouth daily at 10 pm. 30 tablet 12  ? busPIRone (BUSPAR) 15 MG tablet Take 15 mg by mouth 2 (two) times daily.    ? Cholecalciferol (VITAMIN D) 2000 units tablet Take 2,000 Units by mouth daily.    ? clopidogrel (PLAVIX) 75 MG tablet Take 1 tablet  (75 mg total) by mouth daily with breakfast. 30 tablet 12  ? Evolocumab (REPATHA SURECLICK) 160 MG/ML SOAJ Inject 140 mg into the skin every 14 (fourteen) days. 2 mL 11  ? famotidine (PEPCID) 20 MG tablet 1 tablet at bedtime as needed    ? Fluvoxamine Maleate 150 MG CP24 Take 150 mg by mouth daily.    ? lisinopril (PRINIVIL,ZESTRIL) 40 MG tablet Take 40 mg by mouth daily.    ? metoprolol succinate (TOPROL-XL) 50 MG 24 hr tablet Take 50 mg by mouth daily. Take with or immediately following a meal.    ? nitroGLYCERIN (NITROSTAT) 0.4 MG SL tablet Place 0.4 mg under the tongue every 5 (five) minutes as needed for chest pain.    ? Ondansetron HCl (ZOFRAN PO) Take by mouth.    ? promethazine (PHENERGAN) 25 MG tablet 1 tablet as needed    ? SUMAtriptan (IMITREX) 100 MG tablet 1/2 to 1 tablet    ? testosterone cypionate (DEPOTESTOSTERONE CYPIONATE) 200 MG/ML injection Inject 200 mg into the muscle every 14 (fourteen) days.    ? ?No facility-administered medications prior to visit.  ? ? ?PAST MEDICAL HISTORY: ?Past Medical History:  ?Diagnosis Date  ? Anxiety   ? Anxiety and depression   ? Arthritis   ? CAD S/P PCI x 2 to LAD 12/14/2015  ? Mid LAD lesion, 70 %stenosed. --> (Resolute Onyx 3.0 x15),  Dist LAD lesion, 99 %stenosed --> (Resolute Onyx 2.25x15)   ? Coronary artery disease   ? Depression   ? GERD (gastroesophageal reflux disease)   ? Headache   ? Hypertension   ? Laceration of leg not thigh   ? Migraine   ? Progressive angina ([place]) 12/14/2015  ? Cardiac Cath with m & d LAD lesions - PCI x 2 DES stents  ? ? ?PAST SURGICAL HISTORY: ?Past Surgical History:  ?Procedure Laterality Date  ? CARDIAC CATHETERIZATION N/A 12/14/2015  ? Procedure: Left Heart Cath and Coronary Angiography;  Surgeon: [name], MD;  Location: [facility];  Service: Cardiovascular;  Laterality: N/A;  ? CARDIAC CATHETERIZATION N/A 12/14/2015  ? Procedure: Coronary Stent Intervention;  Surgeon: [name], MD;  Location: [facility];  Service: Cardiovascular;  Laterality: N/A;  ? CORONARY STENT PLACEMENT  12/14/2015  ? ?Mid LAD lesion, 70 %stenosed.  ? SHOULDER SURGERY Left 1998  ? ? ?FAMILY HISTORY: ?Family History  ?Problem Relation Age of Onset  ? Heart failure Mother   ? Hypertension Mother   ? High blood pressure Mother   ? Cancer Father   ? High blood pressure Father   ? ? ?SOCIAL HISTORY: ?Social History  ? ?Socioeconomic History  ? Marital status: Married  ?  Spouse name: Not on file  ? Number of children: 1  ? Years of education: Not on file  ? Highest education level: Not on file  ?Occupational History  ? Not on file  ?Tobacco Use  ? Smoking status: Never  ? Smokeless tobacco: Never  ?Vaping Use  ? Vaping Use: Never used  ?Substance and Sexual Activity  ? Alcohol use: Yes  ?  Comment: 3 drinks per week  ? Drug use: No  ? Sexual activity: Not on file  ?Other Topics Concern  ? Not on file  ?Social History Narrative  ? Not on file  ? ?Social Determinants of Health  ? ?Financial Resource Strain: Not on file  ?Food Insecurity: Not on file  ?Transportation Needs: Not on file  ?Physical Activity: Not on file  ?Stress: Not on file  ?Social Connections: Not on file  ?Intimate Partner Violence: Not on file  ? ? ? ? ?PHYSICAL EXAM ?Generalized: Well developed, in no acute distress  ? ?Neurological examination  ?Mentation: Alert oriented to time, place, history taking. Follows all commands speech and language fluent ?Cranial nerve II-XII:Extraocular movements were full. Facial symmetry noted.  ?Reflexes: UTA ? ?DIAGNOSTIC DATA (LABS, IMAGING, TESTING) ?- I reviewed patient records, labs, notes, testing and imaging myself where available. ? ?Lab Results  ?Component Value Date  ? WBC 15.8 (H) 12/15/2015  ? HGB 16.4 12/15/2015  ? HCT 46.7 12/15/2015  ? MCV 86.8 12/15/2015  ? PLT 170 12/15/2015  ? ?   ?Component Value Date/Time  ? NA 135 12/15/2015 0225  ? K 4.2 12/15/2015 0225  ? CL 102 12/15/2015 0225  ? CO2 22 12/15/2015 0225  ?  GLUCOSE 145 (H) 12/15/2015 0225  ? BUN 20 12/15/2015 0225  ? CREATININE 1.41 (H) 12/15/2015 0225  ? CREATININE 1.27 12/08/2015 1101  ? CALCIUM 9.1 12/15/2015 0225  ? PROT 6.9 02/08/2021 0900  ? ALBUMIN 4.6 02/08/2021 0900  ? AST 18 02/08/2021 0900  ? ALT 29 02/08/2021 0900  ? ALKPHOS 98 02/08/2021 0900  ? BILITOT 0.8 02/08/2021 0900  ? GFRNONAA 57 (L) 12/15/2015 0225  ? GFRNONAA 65 12/08/2015 1101  ? GFRAA >60 12/15/2015  0225  ? GFRAA 76 12/08/2015 1101  ? ?Lab Results  ?Component Value Date  ? CHOL 213 (H) 02/08/2021  ? HDL 54 02/08/2021  ? LDLCALC 141 (H) 02/08/2021  ? LDLDIRECT 168.0 05/23/2008  ? TRIG 100 02/08/2021  ? CHOLHDL 3.9 02/08/2021  ? ?No results found for: HGBA1C ?No results found for: VITAMINB12 ?Lab Results  ?Component Value Date  ? TSH 1.00 12/08/2015  ? ? ? ? ?ASSESSMENT AND PLAN ?56 y.o. year old male  has a past medical history of Anxiety, Anxiety and depression, Arthritis, CAD S/P PCI x 2 to LAD (12/14/2015), Coronary artery disease, Depression, GERD (gastroesophageal reflux disease), Headache, Hypertension, Laceration of leg not thigh, Migraine, and Progressive angina ([place]) (12/14/2015). here with: ? ?OSA on CPAP ? ?CPAP compliance excellent ?Residual AHI is good ?Encouraged patient to continue using CPAP nightly and > 4 hours each night ?F/U in 1 year or sooner if needed ? ? ? ?Ward Givens, MSN, NP-C 03/25/2021, 10:57 AM ?[facility] ?[address], [address] ?[place], [place] [address] ?[phone] ? ?

## 2021-03-25 NOTE — Progress Notes (Signed)
?  Epworth Sleepiness Scale ?0= would never doze ?1= slight chance of dozing ?2= moderate chance of dozing ?3= high chance of dozing ? ?Sitting and reading:2 ?Watching TV:3 ?Sitting inactive in a public place (ex. Theater or meeting):2 ?As a passenger in a car for an hour without a break:3 ?Lying down to rest in the afternoon:3 ?Sitting and talking to someone:1 ?Sitting quietly after lunch (no alcohol):2 ?In a car, while stopped in traffic:0 ?Total: 11 ?

## 2021-03-31 DIAGNOSIS — F419 Anxiety disorder, unspecified: Secondary | ICD-10-CM | POA: Diagnosis not present

## 2021-03-31 DIAGNOSIS — F331 Major depressive disorder, recurrent, moderate: Secondary | ICD-10-CM | POA: Diagnosis not present

## 2021-04-01 DIAGNOSIS — G4733 Obstructive sleep apnea (adult) (pediatric): Secondary | ICD-10-CM | POA: Diagnosis not present

## 2021-04-26 DIAGNOSIS — M19012 Primary osteoarthritis, left shoulder: Secondary | ICD-10-CM | POA: Diagnosis not present

## 2021-04-30 DIAGNOSIS — M19012 Primary osteoarthritis, left shoulder: Secondary | ICD-10-CM | POA: Diagnosis not present

## 2021-04-30 DIAGNOSIS — M6281 Muscle weakness (generalized): Secondary | ICD-10-CM | POA: Diagnosis not present

## 2021-04-30 DIAGNOSIS — M25612 Stiffness of left shoulder, not elsewhere classified: Secondary | ICD-10-CM | POA: Diagnosis not present

## 2021-05-03 ENCOUNTER — Inpatient Hospital Stay (HOSPITAL_COMMUNITY): Admission: RE | Admit: 2021-05-03 | Payer: BC Managed Care – PPO | Source: Ambulatory Visit

## 2021-05-04 NOTE — H&P (Signed)
SHOULDER ARTHROPLASTY ADMISSION H&P ? ?Patient ID: ?Martin Guzman ?MRN: [id number] ?DOB/AGE: 1965-05-02 56 y.o. ? ?Chief Complaint: left shoulder pain. ? ?Planned Procedure Date: 05/13/21  ?Medical Clearance by Dr. Quay Burow ?Cardiac Clearance by Almyra Deforest, PA ? ? ?HPI: ?Martin Guzman is a 56 y.o. male who presents for evaluation of djd left shoulder. The patient has a history of pain and functional disability in the left shoulder due to trauma and arthritis and has failed non-surgical conservative treatments for greater than 12 weeks to include corticosteriod injections and activity modification.  Onset of symptoms was abrupt, starting in December of 2022 with gradually worsening course since that time. The patient noted a previous left shoulder arthroscopy with labrum repair on the left shoulder.  Patient currently rates pain at 6 out of 10 with activity. Patient has worsening of pain with activity and weight bearing and pain that interferes with activities of daily living.  Patient has evidence of joint space narrowing by imaging studies.  There is no active infection. ? ?Past Medical History:  ?Diagnosis Date  ? Anxiety   ? Anxiety and depression   ? Arthritis   ? CAD S/P PCI x 2 to LAD 12/14/2015  ? Mid LAD lesion, 70 %stenosed. --> (Resolute Onyx 3.0 x15), Dist LAD lesion, 99 %stenosed --> (Resolute Onyx 2.25x15)   ? Coronary artery disease   ? Depression   ? GERD (gastroesophageal reflux disease)   ? Headache   ? Hypertension   ? Laceration of leg not thigh   ? Migraine   ? Progressive angina ([place]) 12/14/2015  ? Cardiac Cath with m & d LAD lesions - PCI x 2 DES stents  ? ?Past Surgical History:  ?Procedure Laterality Date  ? CARDIAC CATHETERIZATION N/A 12/14/2015  ? Procedure: Left Heart Cath and Coronary Angiography;  Surgeon: [name], MD;  Location: [facility];  Service: Cardiovascular;  Laterality: N/A;  ? CARDIAC CATHETERIZATION N/A 12/14/2015  ? Procedure: Coronary Stent  Intervention;  Surgeon: [name], MD;  Location: [facility];  Service: Cardiovascular;  Laterality: N/A;  ? CORONARY STENT PLACEMENT  12/14/2015  ? ?Mid LAD lesion, 70 %stenosed.  ? SHOULDER SURGERY Left 1998  ? ?Allergies  ?Allergen Reactions  ? Bee Venom Other (See Comments)  ? Shellfish Allergy Other (See Comments)  ?  Lip numbing.   ? ?Prior to Admission medications   ?Medication Sig Start Date End Date Taking? Authorizing Provider  ?ALPRAZolam (XANAX) 0.25 MG tablet Take 0.25 mg by mouth at bedtime as needed for anxiety.  05/26/14  Yes [provider]  ?amitriptyline (ELAVIL) 10 MG tablet Take 10 mg by mouth at bedtime. 11/28/18  Yes [provider]  ?ARIPiprazole (ABILIFY) 5 MG tablet Take 5 mg by mouth daily. 12/20/19  Yes [provider]  ?aspirin 81 MG tablet Take 81 mg by mouth daily.   Yes [provider]  ?atorvastatin (LIPITOR) 80 MG tablet Take 1 tablet (80 mg total) by mouth daily at 10 pm. 12/15/15  Yes Arbutus Leas, NP  ?busPIRone (BUSPAR) 15 MG tablet Take 15 mg by mouth 2 (two) times daily. 01/20/21  Yes [provider]  ?Cholecalciferol (VITAMIN D) 2000 units tablet Take 2,000 Units by mouth daily.   Yes [provider]  ?clopidogrel (PLAVIX) 75 MG tablet Take 1 tablet (75 mg total) by mouth daily with breakfast. 12/16/15  Yes Arbutus Leas, NP  ?Evolocumab (REPATHA SURECLICK) 709 MG/ML SOAJ Inject  140 mg into the skin every 14 (fourteen) days. 03/10/21  Yes [name], MD  ?Fluvoxamine Maleate 150 MG CP24 Take 150 mg by mouth daily. 12/03/13  Yes [provider]  ?lisinopril (PRINIVIL,ZESTRIL) 40 MG tablet Take 40 mg by mouth daily.   Yes [provider]  ?metoprolol succinate (TOPROL-XL) 50 MG 24 hr tablet Take 50 mg by mouth daily. Take with or immediately following a meal.   Yes [provider]  ?nitroGLYCERIN (NITROSTAT) 0.4 MG SL tablet Place 0.4 mg under the tongue every 5 (five) minutes as  needed for chest pain.   Yes [provider]  ?promethazine (PHENERGAN) 25 MG tablet Take 25 mg by mouth every 6 (six) hours as needed for vomiting or nausea. 02/01/19  Yes [provider]  ?SUMAtriptan (IMITREX) 100 MG tablet Take 100 mg by mouth every 2 (two) hours as needed for migraine.   Yes [provider]  ?testosterone cypionate (DEPOTESTOSTERONE CYPIONATE) 200 MG/ML injection Inject 200 mg into the muscle every 14 (fourteen) days. 06/20/13  Yes [provider]  ? ?Social History  ? ?Socioeconomic History  ? Marital status: Married  ?  Spouse name: Not on file  ? Number of children: 1  ? Years of education: Not on file  ? Highest education level: Not on file  ?Occupational History  ? Not on file  ?Tobacco Use  ? Smoking status: Never  ? Smokeless tobacco: Never  ?Vaping Use  ? Vaping Use: Never used  ?Substance and Sexual Activity  ? Alcohol use: Yes  ?  Comment: 3 drinks per week  ? Drug use: No  ? Sexual activity: Not on file  ?Other Topics Concern  ? Not on file  ?Social History Narrative  ? Not on file  ? ?Social Determinants of Health  ? ?Financial Resource Strain: Not on file  ?Food Insecurity: Not on file  ?Transportation Needs: Not on file  ?Physical Activity: Not on file  ?Stress: Not on file  ?Social Connections: Not on file  ? ?Family History  ?Problem Relation Age of Onset  ? Heart failure Mother   ? Hypertension Mother   ? High blood pressure Mother   ? Cancer Father   ? High blood pressure Father   ? ? ?ROS: Currently denies lightheadedness, dizziness, Fever, chills, CP, SOB.   ?No personal history of DVT, PE, MI, or CVA. ?No loose teeth or dentures ?All other systems have been reviewed and were otherwise currently negative with the exception of those mentioned in the HPI and as above. ? ?Objective: ?Vitals: Ht: '5\' 10"'$  Wt: 275 lbs Temp: 97.6 BP:  118/77 Pulse: 52 O2 97% on room air.   ?Physical Exam: ?General: Alert, NAD.   ?HEENT: EOMI, Good Neck Extension   ?Pulm: No increased work of breathing.  Clear B/L A/P w/o crackle or wheeze.  ?CV: RRR, No m/g/r appreciated ?GI: soft, NT, ND ?Neuro: Neuro without gross focal deficit.  Sensation intact distally ?Skin: No lesions in the area of chief complaint ?MSK/Surgical Site: left shoulder pain with range of motion.  Forward flexion/abduction approximately 0-40 degrees.  Internal rotation to mid left buttock.  External rotation to neutral.  No AC pain.  Mild Biceps pain.  Rotator cuff strength.  NVI distally. ? ?Imaging Review ?Plain radiographs and MRI demonstrate severe degenerative joint disease of the left shoulder.   ? ?Assessment: ?Severe left shoulder glenohumeral osteoarthritis ? ? ?Plan: ?Plan for Procedure(s): ?TOTAL SHOULDER ARTHROPLASTY ? ?The patient history,  physical exam, clinical judgement of the provider and imaging are consistent with end stage degenerative joint disease and total joint arthroplasty is deemed medically necessary. The treatment options including medical management, injection therapy, and arthroplasty were discussed at length. The risks and benefits of Procedure(s): ?TOTAL SHOULDER ARTHROPLASTY were presented and reviewed.  ?The risks of nonoperative treatment, versus surgical intervention including but not limited to continued pain, aseptic loosening, stiffness, dislocation/subluxation, infection, bleeding, nerve injury, blood clots, cardiopulmonary complications, morbidity, mortality, among others were discussed. The patient verbalizes understanding and wishes to proceed with the plan.  ?Patient is being admitted for surgery, OT, pain control, prophylactic antibiotics, VTE prophylaxis, progressive ambulation, ADL's and discharge planning.  ? ?Dental prophylaxis discussed and recommended for 2 years postoperatively. ? ?The patient does meet the criteria for TXA which will be used perioperatively.   ?We will restart his home aspirin and Plavix postoperatively DVT prophylaxis in addition to  SCDs, and early ambulation. ?The patient is planning to be discharged home care of his wife ? ?Ventura Bruns, PA-C ?05/04/2021 ?10:29 AM ?  ?

## 2021-05-04 NOTE — Progress Notes (Signed)
Sent message, via epic in basket, requesting orders in epic from surgeon.  

## 2021-05-06 NOTE — Patient Instructions (Addendum)
DUE TO COVID-19 ONLY TWO VISITORS  (aged 56 and older)  IS ALLOWED TO COME WITH YOU AND STAY IN THE WAITING ROOM ONLY DURING PRE OP AND PROCEDURE.   ?**NO VISITORS ARE ALLOWED IN THE SHORT STAY AREA OR RECOVERY ROOM!!** ? ?You are not required to quarantine ?Hand Hygiene often ?Do NOT share personal items ?Notify your provider if you are in close contact with someone who has COVID or you develop fever 100.4 or greater, new onset of sneezing, cough, sore throat, shortness of breath or body aches. ? ?     ? Your procedure is scheduled on:  05-13-21 ? ? Report to [facility] Main Entrance ? ?  Report to admitting at 9:45 AM ? ? Call this number if you have problems the morning of surgery [phone] ? ? Do not eat food :After Midnight. ? ? After Midnight you may have the following liquids until 9:30 AM DAY OF SURGERY ? ?Water ?Black Coffee (sugar ok, NO MILK/CREAM OR CREAMERS)  ?Tea (sugar ok, NO MILK/CREAM OR CREAMERS) regular and decaf                             ?Plain Jell-O (NO RED)                                           ?Fruit ices (not with fruit pulp, NO RED)                                     ?Popsicles (NO RED)                                                                  ?Juice: apple, WHITE grape, WHITE cranberry ?Sports drinks like Gatorade (NO RED) ?Clear broth(vegetable,chicken,beef) ? ?             ?  ?  ?The day of surgery:  ?Drink ONE (1) Pre-Surgery Clear Ensure at 9:30 AM the morning of surgery. Drink in one sitting. Do not sip.  ?This drink was given to you during your hospital  ?pre-op appointment visit. ?Nothing else to drink after completing the Pre-Surgery Clear Ensure. ?  ?       If you have questions, please contact your surgeon?s office. ? ? ?FOLLOW ANY ADDITIONAL PRE OP INSTRUCTIONS YOU RECEIVED FROM YOUR SURGEON'S OFFICE!!! ?  ?  ?Oral Hygiene is also important to reduce your risk of infection.                                    ?Remember - BRUSH YOUR TEETH THE MORNING OF  SURGERY WITH YOUR REGULAR TOOTHPASTE ? ? Do NOT smoke after Midnight ? ? Take these medicines the morning of surgery with A SIP OF WATER:  Abilify, Buspar, Metoprolol.  Phenergan if needed ? ?Bring CPAP mask and tubing day of surgery. ?                  ?  You may not have any metal on your body including  jewelry, and body piercing ? ?           Do not wear  lotions, powders, cologne, or deodorant ? ?            Men may shave face and neck. ? ? Do not bring valuables to the hospital. [place]. ? ? Contacts, dentures or bridgework may not be worn into surgery. ? ?Patients discharged on the day of surgery will not be allowed to drive home.  Someone NEEDS to stay with you for the first 24 hours after anesthesia. ? ?Please read over the following fact sheets you were given: IF [place]  ? ? ?[place]- Preparing for Total Shoulder Arthroplasty  ?  ?Before surgery, you can play an important role. Because skin is not sterile, your skin needs to be as free of germs as possible. You can reduce the number of germs on your skin by using the following products. ?Benzoyl Peroxide Gel ?Reduces the number of germs present on the skin ?Applied twice a day to shoulder area starting two days before surgery   ? ?================================================================== ? ?Please follow these instructions carefully: ? ?BENZOYL PEROXIDE 5% GEL ? ?Please do not use if you have an allergy to benzoyl peroxide.   If your skin becomes reddened/irritated stop using the benzoyl peroxide. ? ?Starting two days before surgery, apply as follows: ?Apply benzoyl peroxide in the morning and at night. Apply after taking a shower. If you are not taking a shower clean entire shoulder front, back, and side along with the armpit with a clean wet washcloth. ? ?Place a quarter-sized dollop on your shoulder and rub in thoroughly,  making sure to cover the front, back, and side of your shoulder, along with the armpit.  ? ?2 days before ____ AM   ____ PM              1 day before ____ AM   ____ PM ?                        ?Do this twice a day for two days.  (Last application is the night before surgery, AFTER using the CHG soap as described below). ? ?Do NOT apply benzoyl peroxide gel on the day of surgery.  ? ?[place] - Preparing for Surgery ?Before surgery, you can play an important role.  Because skin is not sterile, your skin needs to be as free of germs as possible.  You can reduce the number of germs on your skin by washing with CHG (chlorahexidine gluconate) soap before surgery.  CHG is an antiseptic cleaner which kills germs and bonds with the skin to continue killing germs even after washing. ?Please DO NOT use if you have an allergy to CHG or antibacterial soaps.  If your skin becomes reddened/irritated stop using the CHG and inform your nurse when you arrive at Short Stay. ?Do not shave (including legs and underarms) for at least 48 hours prior to the first CHG shower.  You may shave your face/neck. ? ?Please follow these instructions carefully: ? 1.  Shower with CHG Soap the night before surgery and the  morning of surgery. ? 2.  If you choose to wash your hair, wash your hair first as usual with your normal  shampoo. ? 3.  After you shampoo, rinse your hair and body thoroughly to remove the shampoo.                            ? 4.  Use CHG as you would any other liquid soap.  You can apply chg directly to the skin and wash.  Gently with a scrungie or clean washcloth. ? 5.  Apply the CHG Soap to your body ONLY FROM THE NECK DOWN.   Do   not use on face/ open      ?                     Wound or open sores. Avoid contact with eyes, ears mouth and   genitals (private parts).  ?                     Production manager,  Genitals (private parts) with your normal soap. ?            6.  Wash thoroughly, paying special attention to the area where  your    surgery  will be performed. ? 7.  Thoroughly rinse your body with warm water from the neck down. ? 8.  DO NOT shower/wash with your normal soap after using and rinsing off the CHG Soap. ?               9.  Pat yourself dry with a clean towel. ?           10.  Wear clean pajamas. ?           11.  Place clean sheets on your bed the night of your first shower and do not  sleep with pets. ?Day of Surgery : ?Do not apply any lotions/deodorants the morning of surgery.  Please wear clean clothes to the hospital/surgery center. ? ?FAILURE TO FOLLOW THESE INSTRUCTIONS MAY RESULT IN THE CANCELLATION OF YOUR SURGERY ? ?PATIENT SIGNATURE_________________________________ ? ?NURSE SIGNATURE__________________________________ ? ?________________________________________________________________________  ?  ? ?Incentive Spirometer ? ?An incentive spirometer is a tool that can help keep your lungs clear and active. This tool measures how well you are filling your lungs with each breath. Taking long deep breaths may help reverse or decrease the chance of developing breathing (pulmonary) problems (especially infection) following: ?A long period of time when you are unable to move or be active. ?BEFORE THE PROCEDURE  ?If the spirometer includes an indicator to show your best effort, your nurse or respiratory therapist will set it to a desired goal. ?If possible, sit up straight or lean slightly forward. Try not to slouch. ?Hold the incentive spirometer in an upright position. ?INSTRUCTIONS FOR USE  ?Sit on the edge of your bed if possible, or sit up as far as you can in bed or on a chair. ?Hold the incentive spirometer in an upright position. ?Breathe out normally. ?Place the mouthpiece in your mouth and seal your lips tightly around it. ?Breathe in slowly and as deeply as possible, raising the piston or the ball toward the top of the column. ?Hold your breath for 3-5 seconds or for as long as possible. Allow the piston or ball to  fall to the bottom of the column. ?Remove the mouthpiece from your mouth and breathe out normally. ?Rest for a few seconds and repeat Steps 1 through 7 at least 10 times every 1-2 hours when you are

## 2021-05-06 NOTE — Progress Notes (Addendum)
COVID Vaccine Completed:  Yes x2 ?Date COVID Vaccine completed: ?Has received booster:  Yes x1 ?COVID vaccine manufacturer: [employer]  ? ?Date of COVID positive in last 90 days: No ? ?PCP - Jillyn Ledger, FNP ?Cardiologist - Quay Burow, MD ? ?Chest x-ray - N/A ?EKG - 02-03-21 Epic ?Stress Test - greater than 2 years Epic ?ECHO - 09-14-20 Epic ?Cardiac Cath - greater than 2 years Epic ?Pacemaker/ICD device last checked: ?Spinal Cord Stimulator: ? ?Bowel Prep - N/A ? ?Sleep Study - Yes, +sleep apnea ?CPAP - Yes ? ?Fasting Blood Sugar - N/A ?Checks Blood Sugar _____ times a day ? ?Blood Thinner Instructions:  Plavix.  Last dose 05-06-21 ?Aspirin Instructions:  ASA 81.  Last dose 05-06-21 ?Last Dose: ? ?Activity level:   Can go up a flight of stairs and perform activities of daily living without stopping and without symptoms of chest pain or shortness of breath.  Able to exercise without symptoms ? ?Anesthesia review:  CAD, angina, HTN, OSA. Hx of MI with stent placement ? ?Patient denies shortness of breath, fever, cough and chest pain at PAT appointment ? ?Patient verbalized understanding of instructions that were given to them at the PAT appointment. Patient was also instructed that they will need to review over the PAT instructions again at home before surgery.  ?

## 2021-05-11 ENCOUNTER — Encounter (HOSPITAL_COMMUNITY)
Admission: RE | Admit: 2021-05-11 | Discharge: 2021-05-11 | Disposition: A | Discharge status: Home / Self Care | Payer: BC Managed Care – PPO | Source: Ambulatory Visit | Attending: Orthopedic Surgery | Admitting: Orthopedic Surgery

## 2021-05-11 ENCOUNTER — Other Ambulatory Visit: Payer: Self-pay

## 2021-05-11 ENCOUNTER — Encounter (HOSPITAL_COMMUNITY): Payer: Self-pay

## 2021-05-11 VITALS — BP 122/84 | HR 51 | Temp 98.5°F | Resp 16 | Ht 72.0 in | Wt 274.0 lb

## 2021-05-11 DIAGNOSIS — Z955 Presence of coronary angioplasty implant and graft: Secondary | ICD-10-CM | POA: Insufficient documentation

## 2021-05-11 DIAGNOSIS — I2510 Atherosclerotic heart disease of native coronary artery without angina pectoris: Secondary | ICD-10-CM | POA: Insufficient documentation

## 2021-05-11 DIAGNOSIS — Z79899 Other long term (current) drug therapy: Secondary | ICD-10-CM | POA: Insufficient documentation

## 2021-05-11 DIAGNOSIS — G4730 Sleep apnea, unspecified: Secondary | ICD-10-CM | POA: Insufficient documentation

## 2021-05-11 DIAGNOSIS — I10 Essential (primary) hypertension: Secondary | ICD-10-CM | POA: Insufficient documentation

## 2021-05-11 DIAGNOSIS — K219 Gastro-esophageal reflux disease without esophagitis: Secondary | ICD-10-CM | POA: Insufficient documentation

## 2021-05-11 DIAGNOSIS — M1990 Unspecified osteoarthritis, unspecified site: Secondary | ICD-10-CM | POA: Diagnosis not present

## 2021-05-11 DIAGNOSIS — M19012 Primary osteoarthritis, left shoulder: Secondary | ICD-10-CM | POA: Insufficient documentation

## 2021-05-11 DIAGNOSIS — I252 Old myocardial infarction: Secondary | ICD-10-CM | POA: Diagnosis not present

## 2021-05-11 DIAGNOSIS — M25712 Osteophyte, left shoulder: Secondary | ICD-10-CM | POA: Diagnosis not present

## 2021-05-11 DIAGNOSIS — Z01812 Encounter for preprocedural laboratory examination: Secondary | ICD-10-CM | POA: Insufficient documentation

## 2021-05-11 DIAGNOSIS — Z01818 Encounter for other preprocedural examination: Secondary | ICD-10-CM

## 2021-05-11 HISTORY — DX: Acute myocardial infarction, unspecified: I21.9

## 2021-05-11 HISTORY — DX: Sleep apnea, unspecified: G47.30

## 2021-05-11 LAB — CBC
HCT: 47.8 % (ref 39.0–52.0)
Hemoglobin: 16.8 g/dL (ref 13.0–17.0)
MCH: 31.2 pg (ref 26.0–34.0)
MCHC: 35.1 g/dL (ref 30.0–36.0)
MCV: 88.8 fL (ref 80.0–100.0)
Platelets: 176 10*3/uL (ref 150–400)
RBC: 5.38 MIL/uL (ref 4.22–5.81)
RDW: 12.7 % (ref 11.5–15.5)
WBC: 6.5 10*3/uL (ref 4.0–10.5)
nRBC: 0 % (ref 0.0–0.2)

## 2021-05-11 LAB — BASIC METABOLIC PANEL
Anion gap: 8 (ref 5–15)
BUN: 19 mg/dL (ref 6–20)
CO2: 24 mmol/L (ref 22–32)
Calcium: 9.7 mg/dL (ref 8.9–10.3)
Chloride: 108 mmol/L (ref 98–111)
Creatinine, Ser: 1.14 mg/dL (ref 0.61–1.24)
GFR, Estimated: >60 mL/min (ref >60)
Glucose, Bld: 84 mg/dL (ref 70–99)
Potassium: 4.2 mmol/L (ref 3.5–5.1)
Sodium: 140 mmol/L (ref 135–145)

## 2021-05-11 LAB — SURGICAL PCR SCREEN
MRSA, PCR: NEGATIVE
Staphylococcus aureus: NEGATIVE

## 2021-05-11 NOTE — Progress Notes (Signed)
Anesthesia Chart Review ? ? Case: [id number] Date/Time: 05/13/21 1215  ? Procedure: TOTAL SHOULDER ARTHROPLASTY (Left: Shoulder)  ? Anesthesia type: Choice  ? Pre-op diagnosis: djd left shoulder  ? Location: [facility] ROOM 08 / [facility]  ? Surgeons: Marchia Bond, MD  ? ?  ? ? ?DISCUSSION:never smoker with h/o GERD, HTN, CAD s/p PCI x2 to LAD 12/2015, sleep apnea, left shoulder djd scheduled for above procedure 05/13/21 with Dr. Marchia Bond.  ? ?Pt seen by cardiology 02/03/21. Per OV note,  ?"Preoperative clearance ?Patient is scheduled to have his left shoulder repaired by Dr. Mardelle Matte in the near future.  It is a result of a injury incurred during work and therefore is a "work [employer] comp" related procedure.  He recently had a normal 2D echo.  He has been stable from a cardiac point of view.  He is cleared at low risk.  He can stop his Plavix 5 days before the procedure." ? ?Pt reports last dose of Plavix 05/06/21.  ? ?Anticipate pt can proceed with planned procedure barring acute status change.   ?VS: BP 122/84   Pulse (!) 51   Temp 36.9 ?C (Oral)   Resp 16   Ht 6' (1.829 m)   Wt 124.3 kg   SpO2 97%   BMI 37.16 kg/m?  ? ?PROVIDERS: ?[name], FNP is PCP  ? ?Cardiologist - Quay Burow, MD ? ?LABS: Labs reviewed: Acceptable for surgery. ?(all labs ordered are listed, but only abnormal results are displayed) ? ?Labs Reviewed  ?SURGICAL PCR SCREEN  ?BASIC METABOLIC PANEL  ?CBC  ? ? ? ?IMAGES: ? ? ?EKG: ?02/03/21 ?Rate 85 bpm  ?NSR ? ?CV: ?Echo 09/14/2020 ?1. Left ventricular ejection fraction, by estimation, is 60 to 65%. The  ?left ventricle has normal function. The left ventricle has no regional  ?wall motion abnormalities. Left ventricular diastolic parameters were  ?normal. The average left ventricular  ?global longitudinal strain is 23.0 %. The global longitudinal strain is  ?normal.  ? 2. Right ventricular systolic function is normal. The right ventricular  ?size is normal. Tricuspid regurgitation signal is  inadequate for assessing  ?PA pressure.  ? 3. Left atrial size was mildly dilated.  ? 4. Right atrial size was mildly dilated.  ? 5. The mitral valve is normal in structure. Trivial mitral valve  ?regurgitation. No evidence of mitral stenosis.  ? 6. The aortic valve is tricuspid. Aortic valve regurgitation is not  ?visualized. No aortic stenosis is present.  ? 7. Aortic dilatation noted. There is borderline dilatation of the  ?ascending aorta, measuring 37 mm.  ? 8. The inferior vena cava is normal in size with greater than 50%  ?respiratory variability, suggesting right atrial pressure of 3 mmHg.  ?Past Medical History:  ?Diagnosis Date  ? Anxiety   ? Anxiety and depression   ? Arthritis   ? CAD S/P PCI x 2 to LAD 12/14/2015  ? Mid LAD lesion, 70 %stenosed. --> ([employer] 3.0 x15), Dist LAD lesion, 99 %stenosed --> ([employer] 2.25x15)   ? Coronary artery disease   ? Depression   ? GERD (gastroesophageal reflux disease)   ? Headache   ? Hypertension   ? Laceration of leg not thigh   ? Migraine   ? Myocardial infarction [facility])   ? Progressive angina ([place]) 12/14/2015  ? Cardiac Cath with m & d LAD lesions - PCI x 2 DES stents  ? Sleep apnea   ? ? ?Past Surgical History:  ?  Procedure Laterality Date  ? CARDIAC CATHETERIZATION N/A 12/14/2015  ? Procedure: Left Heart Cath and Coronary Angiography;  Surgeon: [name], MD;  Location: [place] CV LAB;  Service: Cardiovascular;  Laterality: N/A;  ? CARDIAC CATHETERIZATION N/A 12/14/2015  ? Procedure: Coronary Stent Intervention;  Surgeon: [name], MD;  Location: [place] CV LAB;  Service: Cardiovascular;  Laterality: N/A;  ? CORONARY STENT PLACEMENT  12/14/2015  ? ?Mid LAD lesion, 70 %stenosed.  ? SHOULDER SURGERY Left 1998  ? WISDOM TOOTH EXTRACTION    ? ? ?MEDICATIONS: ? ALPRAZolam (XANAX) 0.25 MG tablet  ? amitriptyline (ELAVIL) 10 MG tablet  ? ARIPiprazole (ABILIFY) 5 MG tablet  ? aspirin 81 MG tablet  ? atorvastatin (LIPITOR) 80 MG tablet  ?  busPIRone (BUSPAR) 15 MG tablet  ? Cholecalciferol (VITAMIN D) 2000 units tablet  ? clopidogrel (PLAVIX) 75 MG tablet  ? Evolocumab (REPATHA SURECLICK) 299 MG/ML SOAJ  ? Fluvoxamine Maleate 150 MG CP24  ? lisinopril (PRINIVIL,ZESTRIL) 40 MG tablet  ? metoprolol succinate (TOPROL-XL) 50 MG 24 hr tablet  ? nitroGLYCERIN (NITROSTAT) 0.4 MG SL tablet  ? promethazine (PHENERGAN) 25 MG tablet  ? SUMAtriptan (IMITREX) 100 MG tablet  ? testosterone cypionate (DEPOTESTOSTERONE CYPIONATE) 200 MG/ML injection  ? ?No current facility-administered medications for this encounter.  ? ? ?Konrad Felix Ward, PA-C ?[facility] ?[phone] ? ? ? ? ? ? ?

## 2021-05-13 ENCOUNTER — Ambulatory Visit (HOSPITAL_COMMUNITY)
Admission: RE | Admit: 2021-05-13 | Discharge: 2021-05-13 | Disposition: A | Discharge status: Home / Self Care | Payer: BC Managed Care – PPO | Attending: Orthopedic Surgery | Admitting: Orthopedic Surgery

## 2021-05-13 ENCOUNTER — Ambulatory Visit (HOSPITAL_COMMUNITY): Payer: BC Managed Care – PPO | Admitting: Physician Assistant

## 2021-05-13 ENCOUNTER — Ambulatory Visit (HOSPITAL_COMMUNITY): Payer: BC Managed Care – PPO

## 2021-05-13 ENCOUNTER — Ambulatory Visit (HOSPITAL_COMMUNITY): Payer: BC Managed Care – PPO | Admitting: Certified Registered Nurse Anesthetist

## 2021-05-13 ENCOUNTER — Encounter (HOSPITAL_COMMUNITY): Payer: Self-pay | Admitting: Orthopedic Surgery

## 2021-05-13 ENCOUNTER — Encounter (HOSPITAL_COMMUNITY)
Admission: RE | Disposition: A | Discharge status: Home / Self Care | Payer: Self-pay | Source: Home / Self Care | Attending: Orthopedic Surgery

## 2021-05-13 ENCOUNTER — Other Ambulatory Visit: Payer: Self-pay

## 2021-05-13 DIAGNOSIS — M1990 Unspecified osteoarthritis, unspecified site: Secondary | ICD-10-CM | POA: Insufficient documentation

## 2021-05-13 DIAGNOSIS — I2510 Atherosclerotic heart disease of native coronary artery without angina pectoris: Secondary | ICD-10-CM | POA: Diagnosis not present

## 2021-05-13 DIAGNOSIS — Z96612 Presence of left artificial shoulder joint: Secondary | ICD-10-CM | POA: Diagnosis not present

## 2021-05-13 DIAGNOSIS — M19012 Primary osteoarthritis, left shoulder: Secondary | ICD-10-CM | POA: Diagnosis not present

## 2021-05-13 DIAGNOSIS — G8918 Other acute postprocedural pain: Secondary | ICD-10-CM | POA: Diagnosis not present

## 2021-05-13 DIAGNOSIS — M25712 Osteophyte, left shoulder: Secondary | ICD-10-CM | POA: Diagnosis not present

## 2021-05-13 DIAGNOSIS — I10 Essential (primary) hypertension: Secondary | ICD-10-CM | POA: Insufficient documentation

## 2021-05-13 DIAGNOSIS — G4730 Sleep apnea, unspecified: Secondary | ICD-10-CM | POA: Insufficient documentation

## 2021-05-13 DIAGNOSIS — Z471 Aftercare following joint replacement surgery: Secondary | ICD-10-CM | POA: Diagnosis not present

## 2021-05-13 DIAGNOSIS — Z955 Presence of coronary angioplasty implant and graft: Secondary | ICD-10-CM | POA: Diagnosis not present

## 2021-05-13 DIAGNOSIS — I252 Old myocardial infarction: Secondary | ICD-10-CM | POA: Diagnosis not present

## 2021-05-13 DIAGNOSIS — Z96642 Presence of left artificial hip joint: Secondary | ICD-10-CM | POA: Diagnosis not present

## 2021-05-13 HISTORY — PX: TOTAL SHOULDER ARTHROPLASTY: SHX126

## 2021-05-13 SURGERY — ARTHROPLASTY, SHOULDER, TOTAL
Anesthesia: General | Site: Shoulder | Laterality: Left

## 2021-05-13 MED ORDER — ORAL CARE MOUTH RINSE: 15.0000 mL | Freq: Once | OROMUCOSAL | Status: AC

## 2021-05-13 MED ORDER — DEXAMETHASONE SODIUM PHOSPHATE 4 MG/ML IJ SOLN
INTRAMUSCULAR | Status: DC | PRN
  Administered 2021-05-13: 10 mg via INTRAVENOUS

## 2021-05-13 MED ORDER — FENTANYL CITRATE PF 50 MCG/ML IJ SOSY: 25.0000 ug | PREFILLED_SYRINGE | INTRAMUSCULAR | Status: DC | PRN

## 2021-05-13 MED ORDER — PROPOFOL 10 MG/ML IV BOLUS
INTRAVENOUS | Status: AC
  Filled 2021-05-13: qty 20

## 2021-05-13 MED ORDER — DROPERIDOL 2.5 MG/ML IJ SOLN: 0.6250 mg | Freq: Once | INTRAMUSCULAR | Status: DC | PRN

## 2021-05-13 MED ORDER — LACTATED RINGERS IV BOLUS
250.0000 mL | Freq: Once | INTRAVENOUS | Status: AC
  Administered 2021-05-13: 250 mL via INTRAVENOUS

## 2021-05-13 MED ORDER — TRANEXAMIC ACID-NACL 1000-0.7 MG/100ML-% IV SOLN: 1000.0000 mg | Freq: Once | INTRAVENOUS | Status: DC

## 2021-05-13 MED ORDER — GLYCOPYRROLATE 0.2 MG/ML IJ SOLN
INTRAMUSCULAR | Status: AC
  Filled 2021-05-13: qty 1

## 2021-05-13 MED ORDER — FENTANYL CITRATE PF 50 MCG/ML IJ SOSY
50.0000 ug | PREFILLED_SYRINGE | Freq: Once | INTRAMUSCULAR | Status: AC
  Administered 2021-05-13: 100 ug via INTRAVENOUS
  Filled 2021-05-13: qty 2

## 2021-05-13 MED ORDER — TRANEXAMIC ACID-NACL 1000-0.7 MG/100ML-% IV SOLN
1000.0000 mg | INTRAVENOUS | Status: AC
  Administered 2021-05-13: 1000 mg via INTRAVENOUS
  Filled 2021-05-13: qty 100

## 2021-05-13 MED ORDER — POVIDONE-IODINE 10 % EX SWAB
2.0000 "application " | Freq: Once | CUTANEOUS | Status: AC
  Administered 2021-05-13: 2 via TOPICAL

## 2021-05-13 MED ORDER — PROPOFOL 500 MG/50ML IV EMUL
INTRAVENOUS | Status: DC | PRN
  Administered 2021-05-13: 25 ug/kg/min via INTRAVENOUS

## 2021-05-13 MED ORDER — FENTANYL CITRATE (PF) 100 MCG/2ML IJ SOLN
INTRAMUSCULAR | Status: DC | PRN
  Administered 2021-05-13 (×2): 50 ug via INTRAVENOUS

## 2021-05-13 MED ORDER — SODIUM CHLORIDE 0.9 % IR SOLN
Status: DC | PRN
Start: 2021-05-13 — End: 2021-05-13
  Administered 2021-05-13: 1000 mL

## 2021-05-13 MED ORDER — PROPOFOL 10 MG/ML IV BOLUS
INTRAVENOUS | Status: DC | PRN
  Administered 2021-05-13: 200 mg via INTRAVENOUS
  Administered 2021-05-13: 40 mg via INTRAVENOUS
  Administered 2021-05-13: 70 mg via INTRAVENOUS

## 2021-05-13 MED ORDER — ONDANSETRON HCL 4 MG PO TABS: 4.0000 mg | ORAL_TABLET | Freq: Three times a day (TID) | ORAL | 0 refills | Status: AC | PRN

## 2021-05-13 MED ORDER — ONDANSETRON HCL 4 MG/2ML IJ SOLN
INTRAMUSCULAR | Status: AC
  Filled 2021-05-13: qty 2

## 2021-05-13 MED ORDER — METHOCARBAMOL 500 MG IVPB - SIMPLE MED: 500.0000 mg | Freq: Four times a day (QID) | INTRAVENOUS | Status: DC | PRN

## 2021-05-13 MED ORDER — MIDAZOLAM HCL 2 MG/2ML IJ SOLN
1.0000 mg | Freq: Once | INTRAMUSCULAR | Status: AC
  Administered 2021-05-13: 1 mg via INTRAVENOUS
  Filled 2021-05-13: qty 2

## 2021-05-13 MED ORDER — OXYCODONE HCL 5 MG/5ML PO SOLN: 5.0000 mg | Freq: Once | ORAL | Status: DC | PRN

## 2021-05-13 MED ORDER — CEFAZOLIN SODIUM-DEXTROSE 2-4 GM/100ML-% IV SOLN: 2.0000 g | Freq: Four times a day (QID) | INTRAVENOUS | Status: DC

## 2021-05-13 MED ORDER — ONDANSETRON HCL 4 MG/2ML IJ SOLN
INTRAMUSCULAR | Status: DC | PRN
  Administered 2021-05-13: 4 mg via INTRAVENOUS

## 2021-05-13 MED ORDER — OXYCODONE HCL 5 MG PO TABS: 5.0000 mg | ORAL_TABLET | ORAL | 0 refills | Status: AC | PRN

## 2021-05-13 MED ORDER — LACTATED RINGERS IV SOLN: INTRAVENOUS | Status: DC

## 2021-05-13 MED ORDER — EPHEDRINE 5 MG/ML INJ
INTRAVENOUS | Status: AC
  Filled 2021-05-13: qty 5

## 2021-05-13 MED ORDER — GLYCOPYRROLATE 0.2 MG/ML IJ SOLN
INTRAMUSCULAR | Status: DC | PRN
  Administered 2021-05-13: .2 mg via INTRAVENOUS

## 2021-05-13 MED ORDER — ACETAMINOPHEN 500 MG PO TABS
1000.0000 mg | ORAL_TABLET | Freq: Once | ORAL | Status: DC
Start: 2021-05-13 — End: 2021-05-13

## 2021-05-13 MED ORDER — BUPIVACAINE LIPOSOME 1.3 % IJ SUSP
INTRAMUSCULAR | Status: DC | PRN
  Administered 2021-05-13: 10 mL via PERINEURAL

## 2021-05-13 MED ORDER — SENNA-DOCUSATE SODIUM 8.6-50 MG PO TABS: 2.0000 | ORAL_TABLET | Freq: Every day | ORAL | 1 refills | Status: AC

## 2021-05-13 MED ORDER — ESMOLOL HCL 100 MG/10ML IV SOLN
INTRAVENOUS | Status: DC | PRN
  Administered 2021-05-13: 30 mg via INTRAVENOUS

## 2021-05-13 MED ORDER — LIDOCAINE 2% (20 MG/ML) 5 ML SYRINGE
INTRAMUSCULAR | Status: DC | PRN
  Administered 2021-05-13: 100 mg via INTRAVENOUS

## 2021-05-13 MED ORDER — METHOCARBAMOL 500 MG PO TABS: 500.0000 mg | ORAL_TABLET | Freq: Four times a day (QID) | ORAL | Status: DC | PRN

## 2021-05-13 MED ORDER — ROCURONIUM BROMIDE 10 MG/ML (PF) SYRINGE
PREFILLED_SYRINGE | INTRAVENOUS | Status: AC
  Filled 2021-05-13: qty 10

## 2021-05-13 MED ORDER — CHLORHEXIDINE GLUCONATE 0.12 % MT SOLN
15.0000 mL | Freq: Once | OROMUCOSAL | Status: AC
  Administered 2021-05-13: 15 mL via OROMUCOSAL

## 2021-05-13 MED ORDER — SUCCINYLCHOLINE CHLORIDE 200 MG/10ML IV SOSY
PREFILLED_SYRINGE | INTRAVENOUS | Status: DC | PRN
  Administered 2021-05-13: 160 mg via INTRAVENOUS

## 2021-05-13 MED ORDER — STERILE WATER FOR IRRIGATION IR SOLN
Status: DC | PRN
Start: 2021-05-13 — End: 2021-05-13
  Administered 2021-05-13: 2000 mL

## 2021-05-13 MED ORDER — FENTANYL CITRATE (PF) 100 MCG/2ML IJ SOLN
INTRAMUSCULAR | Status: AC
Start: 2021-05-13 — End: ?
  Filled 2021-05-13: qty 2

## 2021-05-13 MED ORDER — ROCURONIUM BROMIDE 10 MG/ML (PF) SYRINGE
PREFILLED_SYRINGE | INTRAVENOUS | Status: DC | PRN
  Administered 2021-05-13: 20 mg via INTRAVENOUS
  Administered 2021-05-13: 50 mg via INTRAVENOUS
  Administered 2021-05-13: 30 mg via INTRAVENOUS

## 2021-05-13 MED ORDER — OXYCODONE HCL 5 MG PO TABS: 5.0000 mg | ORAL_TABLET | Freq: Once | ORAL | Status: DC | PRN

## 2021-05-13 MED ORDER — BACLOFEN 10 MG PO TABS: 10.0000 mg | ORAL_TABLET | Freq: Three times a day (TID) | ORAL | 0 refills | Status: AC

## 2021-05-13 MED ORDER — SUGAMMADEX SODIUM 500 MG/5ML IV SOLN
INTRAVENOUS | Status: AC
  Filled 2021-05-13: qty 5

## 2021-05-13 MED ORDER — 0.9 % SODIUM CHLORIDE (POUR BTL) OPTIME
TOPICAL | Status: DC | PRN
  Administered 2021-05-13: 1000 mL

## 2021-05-13 MED ORDER — ACETAMINOPHEN 500 MG PO TABS
1000.0000 mg | ORAL_TABLET | Freq: Once | ORAL | Status: AC
  Administered 2021-05-13: 1000 mg via ORAL
  Filled 2021-05-13: qty 2

## 2021-05-13 MED ORDER — LACTATED RINGERS IV BOLUS
500.0000 mL | Freq: Once | INTRAVENOUS | Status: AC
  Administered 2021-05-13: 500 mL via INTRAVENOUS

## 2021-05-13 MED ORDER — LIDOCAINE HCL (PF) 2 % IJ SOLN
INTRAMUSCULAR | Status: AC
  Filled 2021-05-13: qty 5

## 2021-05-13 MED ORDER — DEXAMETHASONE SODIUM PHOSPHATE 10 MG/ML IJ SOLN
INTRAMUSCULAR | Status: AC
  Filled 2021-05-13: qty 1

## 2021-05-13 MED ORDER — EPHEDRINE SULFATE-NACL 50-0.9 MG/10ML-% IV SOSY
PREFILLED_SYRINGE | INTRAVENOUS | Status: DC | PRN
  Administered 2021-05-13: 7.5 mg via INTRAVENOUS
  Administered 2021-05-13: 5 mg via INTRAVENOUS

## 2021-05-13 MED ORDER — BUPIVACAINE-EPINEPHRINE (PF) 0.5% -1:200000 IJ SOLN
INTRAMUSCULAR | Status: DC | PRN
  Administered 2021-05-13: 15 mL via PERINEURAL

## 2021-05-13 MED ORDER — CEFAZOLIN IN SODIUM CHLORIDE 3-0.9 GM/100ML-% IV SOLN
3.0000 g | INTRAVENOUS | Status: AC
  Administered 2021-05-13: 3 g via INTRAVENOUS
  Filled 2021-05-13: qty 100

## 2021-05-13 MED ORDER — SUGAMMADEX SODIUM 200 MG/2ML IV SOLN
INTRAVENOUS | Status: DC | PRN
  Administered 2021-05-13: 250 mg via INTRAVENOUS

## 2021-05-13 MED ORDER — PHENYLEPHRINE HCL-NACL 20-0.9 MG/250ML-% IV SOLN
INTRAVENOUS | Status: DC | PRN
  Administered 2021-05-13: 30 ug/min via INTRAVENOUS

## 2021-05-13 SURGICAL SUPPLY — 70 items
ADPR HD STD TPR HUM TI RVRS (Orthopedic Implant) ×1 IMPLANT
AID PSTN UNV HD RSTRNT DISP (MISCELLANEOUS) ×1
BAG COUNTER SPONGE SURGICOUNT (BAG) ×1 IMPLANT
BAG SPEC THK2 15X12 ZIP CLS (MISCELLANEOUS) ×1
BAG SPNG CNTER NS LX DISP (BAG) ×1
BAG ZIPLOCK 12X15 (MISCELLANEOUS) ×3 IMPLANT
BIT DRILL QUICK REL 1/8 2PK SL (DRILL) IMPLANT
BLADE SAW SAG 73X25 THK (BLADE) ×1
BLADE SAW SGTL 73X25 THK (BLADE) ×2 IMPLANT
CEMENT BONE R 1X40 (Cement) ×3 IMPLANT
CLSR STERI-STRIP ANTIMIC 1/2X4 (GAUZE/BANDAGES/DRESSINGS) ×3 IMPLANT
COOLER ICEMAN CLASSIC (MISCELLANEOUS) ×3 IMPLANT
COVER BACK TABLE 60X90IN (DRAPES) ×3 IMPLANT
COVER MAYO STAND STRL (DRAPES) ×3 IMPLANT
COVER SURGICAL LIGHT HANDLE (MISCELLANEOUS) ×3 IMPLANT
DRAPE POUCH INSTRU U-SHP 10X18 (DRAPES) ×3 IMPLANT
DRAPE SHEET LG 3/4 BI-LAMINATE (DRAPES) ×6 IMPLANT
DRAPE SURG 17X11 SM STRL (DRAPES) ×3 IMPLANT
DRAPE U-SHAPE 47X51 STRL (DRAPES) ×3 IMPLANT
DRILL QUICK RELEASE 1/8 INCH (DRILL) ×2
DRSG MEPILEX BORDER 4X8 (GAUZE/BANDAGES/DRESSINGS) ×3 IMPLANT
DURAPREP 26ML APPLICATOR (WOUND CARE) ×3 IMPLANT
ELECT REM PT RETURN 15FT ADLT (MISCELLANEOUS) ×3 IMPLANT
FACESHIELD WRAPAROUND (MASK) ×2 IMPLANT
FACESHIELD WRAPAROUND OR TEAM (MASK) IMPLANT
GLENOID MOD PE 4 PEG SZ 3 (Shoulder) ×1 IMPLANT
GLENOID MOD POST TM SZ2 (Post) ×1 IMPLANT
GLOVE BIO SURGEON STRL SZ 6.5 (GLOVE) ×3 IMPLANT
GLOVE BIO SURGEON STRL SZ7.5 (GLOVE) ×3 IMPLANT
GLOVE BIO SURGEON STRL SZ8 (GLOVE) ×3 IMPLANT
GLOVE BIOGEL PI IND STRL 7.0 (GLOVE) ×2 IMPLANT
GLOVE BIOGEL PI IND STRL 8 (GLOVE) ×4 IMPLANT
GLOVE BIOGEL PI INDICATOR 7.0 (GLOVE) ×1
GLOVE BIOGEL PI INDICATOR 8 (GLOVE) ×2
GOWN STRL REIN XL XLG (GOWN DISPOSABLE) ×6 IMPLANT
HANDPIECE INTERPULSE COAX TIP (DISPOSABLE) ×2
HEAD HUMERAL COMP STD (Orthopedic Implant) IMPLANT
HEAD MODULAR W VARIABLE OFFSET (Orthopedic Implant) IMPLANT
HOOD PEEL AWAY FLYTE STAYCOOL (MISCELLANEOUS) ×4 IMPLANT
HUMERAL HEAD COMP STD (Orthopedic Implant) ×2 IMPLANT
KIT BASIN OR (CUSTOM PROCEDURE TRAY) ×3 IMPLANT
KIT TURNOVER KIT A (KITS) ×1 IMPLANT
MODULAR HEAD W VARIABLE OFFSET (Orthopedic Implant) ×2 IMPLANT
NS IRRIG 1000ML POUR BTL (IV SOLUTION) ×3 IMPLANT
PACK SHOULDER (CUSTOM PROCEDURE TRAY) ×3 IMPLANT
PAD COLD SHLDR WRAP-ON (PAD) ×3 IMPLANT
PIN THREADED REVERSE (PIN) ×1 IMPLANT
PROTECTOR NERVE ULNAR (MISCELLANEOUS) ×2 IMPLANT
RESTRAINT HEAD UNIVERSAL NS (MISCELLANEOUS) ×3 IMPLANT
SET HNDPC FAN SPRY TIP SCT (DISPOSABLE) ×2 IMPLANT
SLING ARM IMMOBILIZER LRG (SOFTGOODS) ×3 IMPLANT
SMARTMIX MINI TOWER (MISCELLANEOUS) ×2
SPIKE FLUID TRANSFER (MISCELLANEOUS) IMPLANT
SPONGE T-LAP 18X18 ~~LOC~~+RFID (SPONGE) ×3 IMPLANT
SPONGE T-LAP 4X18 ~~LOC~~+RFID (SPONGE) ×2 IMPLANT
STEM HUMERAL STRL 10MMX55MM (Stem) ×1 IMPLANT
STRIP CLOSURE SKIN 1/2X4 (GAUZE/BANDAGES/DRESSINGS) ×1 IMPLANT
SUCTION FRAZIER HANDLE 12FR (TUBING) ×2
SUCTION TUBE FRAZIER 12FR DISP (TUBING) ×2 IMPLANT
SUPPORT WRAP ARM LG (MISCELLANEOUS) ×3 IMPLANT
SUT MAXBRAID #2 CVD NDL (SUTURE) ×1 IMPLANT
SUT MAXBRAID #5 CCS-NDL 2PK (SUTURE) ×2 IMPLANT
SUT VIC AB 1 CT1 36 (SUTURE) ×3 IMPLANT
SUT VIC AB 2-0 CT1 27 (SUTURE) ×2
SUT VIC AB 2-0 CT1 TAPERPNT 27 (SUTURE) ×2 IMPLANT
SUT VIC AB 3-0 SH 8-18 (SUTURE) ×3 IMPLANT
TOWEL OR 17X26 10 PK STRL BLUE (TOWEL DISPOSABLE) ×3 IMPLANT
TOWEL OR NON WOVEN STRL DISP B (DISPOSABLE) ×3 IMPLANT
TOWER SMARTMIX MINI (MISCELLANEOUS) ×2 IMPLANT
WATER STERILE IRR 1000ML POUR (IV SOLUTION) ×6 IMPLANT

## 2021-05-13 NOTE — Anesthesia Procedure Notes (Signed)
Anesthesia Regional Block: Interscalene brachial plexus block  ? ?Pre-Anesthetic Checklist: , timeout performed,  Correct Patient, Correct Site, Correct Laterality,  Correct Procedure, Correct Position, site marked,  Risks and benefits discussed,  Pre-op evaluation,  At surgeon's request and post-op pain management ? ?Laterality: Left ? ?Prep: Maximum Sterile Barrier Precautions used, chloraprep     ?  ?Needles:  ?Injection technique: Single-shot ? ?Needle Type: Echogenic Stimulator Needle   ? ? ?Needle Length: 4cm  ?Needle Gauge: 22  ? ? ? ?Additional Needles: ? ? ?Procedures:,,,, ultrasound used (permanent image in chart),,    ?Narrative:  ?Start time: 05/13/2021 12:24 PM ?End time: 05/13/2021 12:27 PM ?Injection made incrementally with aspirations every 5 mL. ? ?Performed by: Personally  ?Anesthesiologist: Brennan Bailey, MD ? ?Additional Notes: ?Risks, benefits, and alternative discussed. Patient gave consent for procedure. Patient prepped and draped in sterile fashion. Sedation administered, patient remains easily responsive to voice. Relevant anatomy identified with ultrasound guidance. Local anesthetic given in 5cc increments with no signs or symptoms of intravascular injection. No pain or paraesthesias with injection. Patient monitored throughout procedure with signs of LAST or immediate complications. Tolerated well. Ultrasound image placed in chart.  ?Tawny Asal, MD ? ? ? ? ? ? ?

## 2021-05-13 NOTE — Transfer of Care (Signed)
Immediate Anesthesia Transfer of Care Note ? ?Patient: Schneider Warchol Faries ? ?Procedure(s) Performed: TOTAL SHOULDER ARTHROPLASTY (Left: Shoulder) ? ?Patient Location: PACU ? ?Anesthesia Type:GA combined with regional for post-op pain ? ?Level of Consciousness: awake, alert , oriented and patient cooperative ? ?Airway & Oxygen Therapy: Patient Spontanous Breathing and Patient connected to face mask oxygen ? ?Post-op Assessment: Report given to RN and Post -op Vital signs reviewed and stable ? ?Post vital signs: Reviewed and stable ? ?Last Vitals:  ?Vitals Value Taken Time  ?BP 123/80 05/13/21 1605  ?Temp    ?Pulse 64 05/13/21 1609  ?Resp 33 05/13/21 1609  ?SpO2 94 % 05/13/21 1609  ?Vitals shown include unvalidated device data. ? ?Last Pain:  ?Vitals:  ? 05/13/21 1041  ?TempSrc:   ?PainSc: 4   ?   ? ?Patients Stated Pain Goal: 3 (05/13/21 1041) ? ?Complications: No notable events documented. ?

## 2021-05-13 NOTE — Anesthesia Procedure Notes (Signed)
Procedure Name: Intubation ?Date/Time: 05/13/2021 1:25 PM ?Performed by: Brennan Bailey, MD ?Pre-anesthesia Checklist: Patient identified, Emergency Drugs available, Suction available and Patient being monitored ?Patient Re-evaluated:Patient Re-evaluated prior to induction ?Oxygen Delivery Method: Circle System Utilized ?Preoxygenation: Pre-oxygenation with 100% oxygen ?Induction Type: IV induction ?Ventilation: Oral airway inserted - appropriate to patient size and Two handed mask ventilation required ?Laryngoscope Size: Sabra Heck and 3 ?Grade View: Grade II ?Tube type: Oral ?Tube size: 7.5 mm ?Number of attempts: 3 ?Airway Equipment and Method: Stylet and Oral airway ?Placement Confirmation: ETT inserted through vocal cords under direct vision, positive ETCO2 and breath sounds checked- equal and bilateral ?Secured at: 23 cm ?Tube secured with: Tape ?Dental Injury: Teeth and Oropharynx as per pre-operative assessment  ?Comments: First and second attempt by SRNA (first with Sabra Heck 2, second with Sabra Heck 3), with inadequate view. Third attempt by myself with Sabra Heck 3, grade 2 view. Mask ventilated between attempts. VSS. SpO2>90% throughout. Daiva Huge, MD ? ? ? ? ?

## 2021-05-13 NOTE — Progress Notes (Signed)
AssistedDr. Birdie Sons with left, interscalene  block. Side rails up, monitors on throughout procedure. See vital signs in flow sheet. Tolerated Procedure well. ? ?

## 2021-05-13 NOTE — Op Note (Signed)
05/11/2021 ? ?3:35 PM ? ? ? ?PRE-OPERATIVE DIAGNOSIS: Left shoulder glenohumeral osteoarthritis ? ?POST-OPERATIVE DIAGNOSIS:  Same ? ?PROCEDURE: LEFT total Shoulder Arthroplasty ? ?SURGEON:  [name], MD ? ?PHYSICIAN ASSISTANT: Merlene Pulling, PA-C, present and scrubbed throughout the case, critical for completion in a timely fashion, and for retraction, instrumentation, and closure. ? ?ANESTHESIA:   General with an interscalene block ? ?ESTIMATED BLOOD LOSS: 150 mL ? ?UNIQUE ASPECTS OF THE CASE: There was advanced arthrosis on both the glenoid and the humeral side.  It was fairly difficult to access the humeral head, external rotation was challenging given his body habitus, I did cut the head twice in order to optimize access to the glenoid.  I took down a little bit more anteriorly than posteriorly on the glenoid side, and had all 4 holes contained within the vault.  I reamed up to a size 12, but he had excellent metaphyseal bone stock and I only needed a size 10 in order to achieve stable fixation. ? ?PREOPERATIVE INDICATIONS:  The patient is a  56yo male who failed conservative measures and elected for surgical management.   ? ?The risks benefits and alternatives were discussed with the patient preoperatively including but not limited to the risks of infection, bleeding, nerve injury, cardiopulmonary complications, the need for revision surgery, dislocation, loosening, incomplete relief of pain, among others, and the patient was willing to proceed. ? ? ?OPERATIVE IMPLANTS: Biomet size 10 micro press-fit humeral stem, size 46+18 versa-dial humeral head, set in the C position with increased coverage posteriorly, with a 4 cemented glenoid polyethylene 3 peg implant with a central regenerex noncemented post.  ? ?OPERATIVE FINDINGS: Advanced glenohumeral osteoarthritis involving the glenoid and the humeral head with substantial osteophyte formation inferiorly. ?  ?OPERATIVE PROCEDURE: The patient was brought to  the operating room and placed in the supine position. General anesthesia was administered. IV antibiotics were given.  The upper extremity was prepped and draped in usual sterile fashion. The patient was in a beachchair position with all bony prominences padded.  ? ?Time out was performed and a deltopectoral approach was carried out. The biceps tendon was tenodesed to the pectoralis tendon. The subscapularis was released, tagging it with a #2 FiberWire, leaving a cuff of tendon for repair.  ? ?The inferior osteophyte was removed, and release of the capsule off of the humeral side was completed. The head was dislocated, and I reamed sequentially. I placed the humeral cutting guide at 30? of retroversion, and then pinned this into place, and made my humeral neck cut. This was at the appropriate level.  ? ?I then placed deep retractors and exposed the glenoid. I excised the labrum circumferentially, taking care to protect the axillary nerve inferiorly. It was still a little tight so I took a second head cut. ? ?I then placed a guidewire into the center position, controlling appropriate version and inclination. I then reamed over the guidewire with the small reamer, and was satisfied with the preparation. I preserved the subchondral bone in order to maximize the strength and minimize the risk for subsequent subsidence.  ? ?I then drilled the central hole for the regenerex peg, and then placed the guide, and then drilled the 3 peripheral peg holes. I had excellent bony circumferential contact.  ? ?I then cleaned the glenoid, irrigated it copiously, and then dried it and cemented the prosthesis into place. Excellent seating was achieved. I had full exposure. The cement cured while I turned my attention to the  humeral side.  ? ?I sequentially broached, up to the selected size, with the broach set at 30? of retroversion. I placed 3 #5 Maxbraid through the bone for subsequent repair.  I then placed the real stem. I trialed  with multiple heads, and the above-named component was selected. Increased posterior coverage improved the coverage. The soft tissue tension was appropriate.  ? ?I then impacted the real humeral head into place, reduced the head, and irrigated copiously. Excellent stability and range of motion was achieved. I repaired the subscapularis with a total of 5 #2 FiberWire; one for the interval, one for the corner, and then the remaining three from the lesser tuberosity which had already been passed.  Excellent repair achieved and I irrigated copiously once more. The subcutaneous tissue was closed with Vicryl including the deltopectoral fascia.  ? ?The skin was closed with Steri-Strips and sterile gauze was applied. He had a preoperative nerve block. He tolerated the procedure well and there were no complications.  ? ?

## 2021-05-13 NOTE — Anesthesia Preprocedure Evaluation (Addendum)
Anesthesia Evaluation  ?Patient identified by MRN, date of birth, ID band ?Patient awake ? ? ? ?Reviewed: ?Allergy & Precautions, NPO status , Patient's Chart, lab work & pertinent test results, reviewed documented beta blocker date and time  ? ?History of Anesthesia Complications ?Negative for: history of anesthetic complications ? ?Airway ?Mallampati: II ? ?TM Distance: >3 FB ?Neck ROM: Full ? ? ? Dental ? ?(+) Missing,  ?  ?Pulmonary ?sleep apnea ,  ?  ?Pulmonary exam normal ? ? ? ? ? ? ? Cardiovascular ?hypertension, Pt. on medications and Pt. on home beta blockers ?+ CAD (on Plavix), + Past MI and + Cardiac Stents (2017)  ?Normal cardiovascular exam ? ?Echo 09/14/2020: EF 60-65%, mild LAE/RAE, borderline dilatation of ascending aorta measuring 32m  ?  ?Neuro/Psych ? Headaches, Anxiety Depression   ? GI/Hepatic ?Neg liver ROS, GERD  ,  ?Endo/Other  ?negative endocrine ROS ? Renal/GU ?negative Renal ROS  ?negative genitourinary ?  ?Musculoskeletal ? ?(+) Arthritis ,  ? Abdominal ?  ?Peds ? Hematology ?negative hematology ROS ?(+)   ?Anesthesia Other Findings ?Day of surgery medications reviewed with patient. ? Reproductive/Obstetrics ?negative OB ROS ? ?  ? ? ? ? ? ? ? ? ? ? ? ? ? ?  ?  ? ? ? ? ? ? ? ?Anesthesia Physical ?Anesthesia Plan ? ?ASA: 3 ? ?Anesthesia Plan:   ? ?Post-op Pain Management: Tylenol PO (pre-op)* and Regional block*  ? ?Induction: Intravenous ? ?PONV Risk Score and Plan: 1 and Treatment may vary due to age or medical condition, Midazolam, Dexamethasone and Ondansetron ? ?Airway Management Planned: Oral ETT ? ?Additional Equipment: None ? ?Intra-op Plan:  ? ?Post-operative Plan: Extubation in OR ? ?Informed Consent: I have reviewed the patients History and Physical, chart, labs and discussed the procedure including the risks, benefits and alternatives for the proposed anesthesia with the patient or authorized representative who has indicated his/her  understanding and acceptance.  ? ? ? ?Dental advisory given ? ?Plan Discussed with: CRNA ? ?Anesthesia Plan Comments:   ? ? ? ? ? ?Anesthesia Quick Evaluation ? ?

## 2021-05-13 NOTE — Interval H&P Note (Signed)
History and Physical Interval Note: ? ?05/13/2021 ?1:06 PM ? ?Martin Guzman  has presented today for surgery, with the diagnosis of djd left shoulder.  The various methods of treatment have been discussed with the patient and family. After consideration of risks, benefits and other options for treatment, the patient has consented to  Procedure(s): ?TOTAL SHOULDER ARTHROPLASTY (Left) as a surgical intervention.  The patient's history has been reviewed, patient examined, no change in status, stable for surgery.  I have reviewed the patient's chart and labs.  Questions were answered to the patient's satisfaction.   ? ? ?[name] ? ? ?

## 2021-05-13 NOTE — Discharge Instructions (Signed)
Diet: As you were doing prior to hospitalization  ? ?Shower:  May shower but keep the wounds dry, use an occlusive plastic wrap, NO SOAKING IN TUB.  If the bandage gets wet, change with a clean dry gauze.   ? ?Dressing:  Keep dressing in place until follow-up visit in 2 weeks. If you see significant drainage on or leaking from your bandage, please call our office to have it changed.  ? ?Activity:  Increase activity slowly as tolerated, but follow the weight bearing instructions below.  The rules on driving is that you can not be taking narcotics while you drive, and you must feel in control of the vehicle.   ? ?Weight Bearing:   Non-weight bearing with left arm. Ok for motion at left wrist and hand. No motion at shoulder. Keep in sling. ? ?To prevent constipation: you may use a stool softener such as - ? ?Colace (over the counter) 100 mg by mouth twice a day  ?Drink plenty of fluids (prune juice may be helpful) and high fiber foods ?Miralax (over the counter) for constipation as needed.   ? ?Itching:  If you experience itching with your medications, try taking only a single pain pill, or even half a pain pill at a time.  You may take up to 10 pain pills per day, and you can also use benadryl over the counter for itching or also to help with sleep.  ? ?Precautions:  If you experience chest pain or shortness of breath - call 911 immediately for transfer to the hospital emergency department!! ? ?If you develop a fever greater that 101 F, purulent drainage from wound, increased redness or drainage from wound, or calf pain -- Call the office at [phone]                                                ?Follow- Up Appointment:  Please call for an appointment to be seen in 2 weeks [place] - [phone] ? ? ?  ?  ?

## 2021-05-14 ENCOUNTER — Encounter (HOSPITAL_COMMUNITY): Payer: Self-pay | Admitting: Orthopedic Surgery

## 2021-05-17 NOTE — Anesthesia Postprocedure Evaluation (Signed)
Anesthesia Post Note ? ?Patient: Martin Guzman ? ?Procedure(s) Performed: TOTAL SHOULDER ARTHROPLASTY (Left: Shoulder) ? ?  ? ?Patient location during evaluation: PACU ?Anesthesia Type: General ?Level of consciousness: awake and alert ?Pain management: pain level controlled ?Vital Signs Assessment: post-procedure vital signs reviewed and stable ?Respiratory status: spontaneous breathing, nonlabored ventilation and respiratory function stable ?Cardiovascular status: blood pressure returned to baseline ?Postop Assessment: no apparent nausea or vomiting ?Anesthetic complications: no ? ? ?No notable events documented. ? ?Last Vitals:  ?Vitals:  ? 05/13/21 1645 05/13/21 1715  ?BP: 112/67 110/67  ?Pulse: 70 71  ?Resp: 20   ?Temp: 36.8 ?C   ?SpO2: 95% 96%  ?  ?Last Pain:  ?Vitals:  ? 05/13/21 1715  ?TempSrc:   ?PainSc: 0-No pain  ? ? ?  ?  ?  ?  ?  ?  ? ?Marthenia Rolling ? ? ? ? ?

## 2021-05-26 DIAGNOSIS — M19012 Primary osteoarthritis, left shoulder: Secondary | ICD-10-CM | POA: Diagnosis not present

## 2021-06-28 DIAGNOSIS — M19012 Primary osteoarthritis, left shoulder: Secondary | ICD-10-CM | POA: Diagnosis not present

## 2021-07-01 DIAGNOSIS — M25512 Pain in left shoulder: Secondary | ICD-10-CM | POA: Diagnosis not present

## 2021-07-01 DIAGNOSIS — Z96612 Presence of left artificial shoulder joint: Secondary | ICD-10-CM | POA: Diagnosis not present

## 2021-07-01 DIAGNOSIS — M6281 Muscle weakness (generalized): Secondary | ICD-10-CM | POA: Diagnosis not present

## 2021-07-01 DIAGNOSIS — M2569 Stiffness of other specified joint, not elsewhere classified: Secondary | ICD-10-CM | POA: Diagnosis not present

## 2021-07-01 DIAGNOSIS — F331 Major depressive disorder, recurrent, moderate: Secondary | ICD-10-CM | POA: Diagnosis not present

## 2021-07-01 DIAGNOSIS — F419 Anxiety disorder, unspecified: Secondary | ICD-10-CM | POA: Diagnosis not present

## 2021-07-05 DIAGNOSIS — M6281 Muscle weakness (generalized): Secondary | ICD-10-CM | POA: Diagnosis not present

## 2021-07-05 DIAGNOSIS — M25512 Pain in left shoulder: Secondary | ICD-10-CM | POA: Diagnosis not present

## 2021-07-05 DIAGNOSIS — M2569 Stiffness of other specified joint, not elsewhere classified: Secondary | ICD-10-CM | POA: Diagnosis not present

## 2021-07-05 DIAGNOSIS — Z96612 Presence of left artificial shoulder joint: Secondary | ICD-10-CM | POA: Diagnosis not present

## 2021-07-09 DIAGNOSIS — M2569 Stiffness of other specified joint, not elsewhere classified: Secondary | ICD-10-CM | POA: Diagnosis not present

## 2021-07-09 DIAGNOSIS — Z96612 Presence of left artificial shoulder joint: Secondary | ICD-10-CM | POA: Diagnosis not present

## 2021-07-09 DIAGNOSIS — M6281 Muscle weakness (generalized): Secondary | ICD-10-CM | POA: Diagnosis not present

## 2021-07-09 DIAGNOSIS — M25512 Pain in left shoulder: Secondary | ICD-10-CM | POA: Diagnosis not present

## 2021-07-14 DIAGNOSIS — Z96612 Presence of left artificial shoulder joint: Secondary | ICD-10-CM | POA: Diagnosis not present

## 2021-07-14 DIAGNOSIS — M6281 Muscle weakness (generalized): Secondary | ICD-10-CM | POA: Diagnosis not present

## 2021-07-14 DIAGNOSIS — M25512 Pain in left shoulder: Secondary | ICD-10-CM | POA: Diagnosis not present

## 2021-07-14 DIAGNOSIS — M2569 Stiffness of other specified joint, not elsewhere classified: Secondary | ICD-10-CM | POA: Diagnosis not present

## 2021-07-16 DIAGNOSIS — M6281 Muscle weakness (generalized): Secondary | ICD-10-CM | POA: Diagnosis not present

## 2021-07-16 DIAGNOSIS — M25512 Pain in left shoulder: Secondary | ICD-10-CM | POA: Diagnosis not present

## 2021-07-16 DIAGNOSIS — Z96612 Presence of left artificial shoulder joint: Secondary | ICD-10-CM | POA: Diagnosis not present

## 2021-07-16 DIAGNOSIS — M2569 Stiffness of other specified joint, not elsewhere classified: Secondary | ICD-10-CM | POA: Diagnosis not present

## 2021-07-20 DIAGNOSIS — Z96612 Presence of left artificial shoulder joint: Secondary | ICD-10-CM | POA: Diagnosis not present

## 2021-07-20 DIAGNOSIS — M6281 Muscle weakness (generalized): Secondary | ICD-10-CM | POA: Diagnosis not present

## 2021-07-20 DIAGNOSIS — M25512 Pain in left shoulder: Secondary | ICD-10-CM | POA: Diagnosis not present

## 2021-07-20 DIAGNOSIS — M2569 Stiffness of other specified joint, not elsewhere classified: Secondary | ICD-10-CM | POA: Diagnosis not present

## 2021-07-21 DIAGNOSIS — H5213 Myopia, bilateral: Secondary | ICD-10-CM | POA: Diagnosis not present

## 2021-07-22 DIAGNOSIS — M6281 Muscle weakness (generalized): Secondary | ICD-10-CM | POA: Diagnosis not present

## 2021-07-22 DIAGNOSIS — M2569 Stiffness of other specified joint, not elsewhere classified: Secondary | ICD-10-CM | POA: Diagnosis not present

## 2021-07-22 DIAGNOSIS — M25512 Pain in left shoulder: Secondary | ICD-10-CM | POA: Diagnosis not present

## 2021-07-22 DIAGNOSIS — Z96612 Presence of left artificial shoulder joint: Secondary | ICD-10-CM | POA: Diagnosis not present

## 2021-07-26 DIAGNOSIS — M19012 Primary osteoarthritis, left shoulder: Secondary | ICD-10-CM | POA: Diagnosis not present

## 2021-07-29 DIAGNOSIS — Z96612 Presence of left artificial shoulder joint: Secondary | ICD-10-CM | POA: Diagnosis not present

## 2021-07-29 DIAGNOSIS — M6281 Muscle weakness (generalized): Secondary | ICD-10-CM | POA: Diagnosis not present

## 2021-07-29 DIAGNOSIS — M2569 Stiffness of other specified joint, not elsewhere classified: Secondary | ICD-10-CM | POA: Diagnosis not present

## 2021-07-29 DIAGNOSIS — M25512 Pain in left shoulder: Secondary | ICD-10-CM | POA: Diagnosis not present

## 2021-08-02 DIAGNOSIS — M6281 Muscle weakness (generalized): Secondary | ICD-10-CM | POA: Diagnosis not present

## 2021-08-02 DIAGNOSIS — M2569 Stiffness of other specified joint, not elsewhere classified: Secondary | ICD-10-CM | POA: Diagnosis not present

## 2021-08-02 DIAGNOSIS — Z96612 Presence of left artificial shoulder joint: Secondary | ICD-10-CM | POA: Diagnosis not present

## 2021-08-02 DIAGNOSIS — M25512 Pain in left shoulder: Secondary | ICD-10-CM | POA: Diagnosis not present

## 2021-08-04 DIAGNOSIS — M6281 Muscle weakness (generalized): Secondary | ICD-10-CM | POA: Diagnosis not present

## 2021-08-04 DIAGNOSIS — Z96612 Presence of left artificial shoulder joint: Secondary | ICD-10-CM | POA: Diagnosis not present

## 2021-08-04 DIAGNOSIS — M25512 Pain in left shoulder: Secondary | ICD-10-CM | POA: Diagnosis not present

## 2021-08-04 DIAGNOSIS — M2569 Stiffness of other specified joint, not elsewhere classified: Secondary | ICD-10-CM | POA: Diagnosis not present

## 2021-08-09 DIAGNOSIS — Z96612 Presence of left artificial shoulder joint: Secondary | ICD-10-CM | POA: Diagnosis not present

## 2021-08-09 DIAGNOSIS — M25512 Pain in left shoulder: Secondary | ICD-10-CM | POA: Diagnosis not present

## 2021-08-09 DIAGNOSIS — M6281 Muscle weakness (generalized): Secondary | ICD-10-CM | POA: Diagnosis not present

## 2021-08-09 DIAGNOSIS — M2569 Stiffness of other specified joint, not elsewhere classified: Secondary | ICD-10-CM | POA: Diagnosis not present

## 2021-08-13 DIAGNOSIS — M6281 Muscle weakness (generalized): Secondary | ICD-10-CM | POA: Diagnosis not present

## 2021-08-13 DIAGNOSIS — M25512 Pain in left shoulder: Secondary | ICD-10-CM | POA: Diagnosis not present

## 2021-08-13 DIAGNOSIS — M2569 Stiffness of other specified joint, not elsewhere classified: Secondary | ICD-10-CM | POA: Diagnosis not present

## 2021-08-13 DIAGNOSIS — Z96612 Presence of left artificial shoulder joint: Secondary | ICD-10-CM | POA: Diagnosis not present

## 2021-08-16 DIAGNOSIS — M2569 Stiffness of other specified joint, not elsewhere classified: Secondary | ICD-10-CM | POA: Diagnosis not present

## 2021-08-16 DIAGNOSIS — M6281 Muscle weakness (generalized): Secondary | ICD-10-CM | POA: Diagnosis not present

## 2021-08-16 DIAGNOSIS — Z96612 Presence of left artificial shoulder joint: Secondary | ICD-10-CM | POA: Diagnosis not present

## 2021-08-16 DIAGNOSIS — M25512 Pain in left shoulder: Secondary | ICD-10-CM | POA: Diagnosis not present

## 2021-08-18 DIAGNOSIS — M25512 Pain in left shoulder: Secondary | ICD-10-CM | POA: Diagnosis not present

## 2021-08-18 DIAGNOSIS — M6281 Muscle weakness (generalized): Secondary | ICD-10-CM | POA: Diagnosis not present

## 2021-08-18 DIAGNOSIS — M2569 Stiffness of other specified joint, not elsewhere classified: Secondary | ICD-10-CM | POA: Diagnosis not present

## 2021-08-18 DIAGNOSIS — Z96612 Presence of left artificial shoulder joint: Secondary | ICD-10-CM | POA: Diagnosis not present

## 2021-08-23 DIAGNOSIS — Z96612 Presence of left artificial shoulder joint: Secondary | ICD-10-CM | POA: Diagnosis not present

## 2021-08-23 DIAGNOSIS — M6281 Muscle weakness (generalized): Secondary | ICD-10-CM | POA: Diagnosis not present

## 2021-08-23 DIAGNOSIS — M25512 Pain in left shoulder: Secondary | ICD-10-CM | POA: Diagnosis not present

## 2021-08-23 DIAGNOSIS — M2569 Stiffness of other specified joint, not elsewhere classified: Secondary | ICD-10-CM | POA: Diagnosis not present

## 2021-08-25 DIAGNOSIS — M2569 Stiffness of other specified joint, not elsewhere classified: Secondary | ICD-10-CM | POA: Diagnosis not present

## 2021-08-25 DIAGNOSIS — Z96612 Presence of left artificial shoulder joint: Secondary | ICD-10-CM | POA: Diagnosis not present

## 2021-08-25 DIAGNOSIS — M6281 Muscle weakness (generalized): Secondary | ICD-10-CM | POA: Diagnosis not present

## 2021-08-25 DIAGNOSIS — M25512 Pain in left shoulder: Secondary | ICD-10-CM | POA: Diagnosis not present

## 2021-08-27 DIAGNOSIS — E782 Mixed hyperlipidemia: Secondary | ICD-10-CM | POA: Diagnosis not present

## 2021-08-27 DIAGNOSIS — E559 Vitamin D deficiency, unspecified: Secondary | ICD-10-CM | POA: Diagnosis not present

## 2021-08-27 DIAGNOSIS — I10 Essential (primary) hypertension: Secondary | ICD-10-CM | POA: Diagnosis not present

## 2021-08-27 DIAGNOSIS — Z0000 Encounter for general adult medical examination without abnormal findings: Secondary | ICD-10-CM | POA: Diagnosis not present

## 2021-08-27 DIAGNOSIS — R7303 Prediabetes: Secondary | ICD-10-CM | POA: Diagnosis not present

## 2021-08-27 DIAGNOSIS — E291 Testicular hypofunction: Secondary | ICD-10-CM | POA: Diagnosis not present

## 2021-09-29 DIAGNOSIS — F331 Major depressive disorder, recurrent, moderate: Secondary | ICD-10-CM | POA: Diagnosis not present

## 2021-09-29 DIAGNOSIS — F419 Anxiety disorder, unspecified: Secondary | ICD-10-CM | POA: Diagnosis not present

## 2021-11-03 DIAGNOSIS — M19012 Primary osteoarthritis, left shoulder: Secondary | ICD-10-CM | POA: Diagnosis not present

## 2022-02-24 DIAGNOSIS — F331 Major depressive disorder, recurrent, moderate: Secondary | ICD-10-CM | POA: Diagnosis not present

## 2022-02-24 DIAGNOSIS — F419 Anxiety disorder, unspecified: Secondary | ICD-10-CM | POA: Diagnosis not present

## 2022-02-25 DIAGNOSIS — E291 Testicular hypofunction: Secondary | ICD-10-CM | POA: Diagnosis not present

## 2022-02-25 DIAGNOSIS — I10 Essential (primary) hypertension: Secondary | ICD-10-CM | POA: Diagnosis not present

## 2022-02-25 DIAGNOSIS — R7303 Prediabetes: Secondary | ICD-10-CM | POA: Diagnosis not present

## 2022-02-25 DIAGNOSIS — M545 Low back pain, unspecified: Secondary | ICD-10-CM | POA: Diagnosis not present

## 2022-03-07 ENCOUNTER — Encounter: Payer: Self-pay | Admitting: Pharmacist

## 2022-03-07 ENCOUNTER — Other Ambulatory Visit: Payer: Self-pay | Admitting: Pharmacist

## 2022-03-07 DIAGNOSIS — I2510 Atherosclerotic heart disease of native coronary artery without angina pectoris: Secondary | ICD-10-CM

## 2022-03-07 DIAGNOSIS — E785 Hyperlipidemia, unspecified: Secondary | ICD-10-CM

## 2022-03-07 MED ORDER — REPATHA SURECLICK 140 MG/ML ~~LOC~~ SOAJ: 140.0000 mg | SUBCUTANEOUS | 1 refills | Status: DC

## 2022-03-07 NOTE — Telephone Encounter (Signed)
Refill request for Repatha

## 2022-03-28 ENCOUNTER — Encounter: Payer: Self-pay | Admitting: *Deleted

## 2022-03-28 NOTE — Progress Notes (Unsigned)
PATIENT: Martin Guzman DOB: 01-10-1965  REASON FOR VISIT: follow up HISTORY FROM: patient PRIMARY NEUROLOGIST:   Virtual Visit via Video Note  I connected with Chrissie Noa Seipp on 03/29/22 at  2:45 PM EDT by a video enabled telemedicine application located remotely at [facility] and verified that I am speaking with the correct person using two identifiers who was located at their own home.   I discussed the limitations of evaluation and management by telemedicine and the availability of in person appointments. The patient expressed understanding and agreed to proceed.   PATIENT: Martin Guzman DOB: May 02, 1965  REASON FOR VISIT: follow up HISTORY FROM: patient  HISTORY OF PRESENT ILLNESS: Today 03/29/22:  Martin Guzman is a 57 y.o. male with a history of OSA on CPAP. Returns today for follow-up.  Reports that CPAP is working well for him.  He does not like to sleep without it.  His download is below       03/25/21: Mr. Klopfenstein is a 57 year old male with a history of obstructive sleep apnea on CPAP.  He returns today for follow-up.  Reorts that the CPAP is working well for him.  Denies any new issues    REVIEW OF SYSTEMS: Out of a complete 14 system review of symptoms, the patient complains only of the following symptoms, and all other reviewed systems are negative.  ESS 11  ALLERGIES: Allergies  Allergen Reactions   Bee Venom Other (See Comments)   Shellfish Allergy Other (See Comments)    Lip numbing.     HOME MEDICATIONS: Outpatient Medications Prior to Visit  Medication Sig Dispense Refill   ALPRAZolam (XANAX) 0.25 MG tablet Take 0.25 mg by mouth at bedtime as needed for anxiety.      amitriptyline (ELAVIL) 10 MG tablet Take 10 mg by mouth at bedtime.     ARIPiprazole (ABILIFY) 5 MG tablet Take 5 mg by mouth daily.     aspirin 81 MG tablet Take 81 mg by mouth daily.     atorvastatin (LIPITOR) 80 MG tablet Take 1 tablet (80 mg total) by  mouth daily at 10 pm. 30 tablet 12   baclofen (LIORESAL) 10 MG tablet Take 1 tablet (10 mg total) by mouth 3 (three) times daily. As needed for muscle spasm 50 tablet 0   busPIRone (BUSPAR) 15 MG tablet Take 15 mg by mouth 2 (two) times daily.     Cholecalciferol (VITAMIN D) 2000 units tablet Take 2,000 Units by mouth daily.     clopidogrel (PLAVIX) 75 MG tablet Take 1 tablet (75 mg total) by mouth daily with breakfast. 30 tablet 12   Evolocumab (REPATHA SURECLICK) 140 MG/ML SOAJ Inject 140 mg into the skin every 14 (fourteen) days. 2 mL 1   Fluvoxamine Maleate 150 MG CP24 Take 150 mg by mouth daily.     lisinopril (PRINIVIL,ZESTRIL) 40 MG tablet Take 40 mg by mouth daily.     metoprolol succinate (TOPROL-XL) 50 MG 24 hr tablet Take 50 mg by mouth daily. Take with or immediately following a meal.     nitroGLYCERIN (NITROSTAT) 0.4 MG SL tablet Place 0.4 mg under the tongue every 5 (five) minutes as needed for chest pain.     ondansetron (ZOFRAN) 4 MG tablet Take 1 tablet (4 mg total) by mouth every 8 (eight) hours as needed for nausea or vomiting. 10 tablet 0   oxyCODONE (ROXICODONE) 5 MG immediate release tablet Take 1 tablet (5 mg total) by  mouth every 4 (four) hours as needed for severe pain. 30 tablet 0   promethazine (PHENERGAN) 25 MG tablet Take 25 mg by mouth every 6 (six) hours as needed for vomiting or nausea.     sennosides-docusate sodium (SENOKOT-S) 8.6-50 MG tablet Take 2 tablets by mouth daily. 30 tablet 1   SUMAtriptan (IMITREX) 100 MG tablet Take 100 mg by mouth every 2 (two) hours as needed for migraine.     testosterone cypionate (DEPOTESTOSTERONE CYPIONATE) 200 MG/ML injection Inject 200 mg into the muscle every 14 (fourteen) days.     No facility-administered medications prior to visit.    PAST MEDICAL HISTORY: Past Medical History:  Diagnosis Date   Anxiety    Anxiety and depression    Arthritis    CAD S/P PCI x 2 to LAD 12/14/2015   Mid LAD lesion, 70 %stenosed. -->  (Resolute Onyx 3.0 x15), Dist LAD lesion, 99 %stenosed --> (Resolute Onyx 2.25x15)    Coronary artery disease    Depression    GERD (gastroesophageal reflux disease)    Headache    Hypertension    Laceration of leg not thigh    Migraine    Myocardial infarction [facility])    Progressive angina (HCC) 12/14/2015   Cardiac Cath with m & d LAD lesions - PCI x 2 DES stents   Sleep apnea     PAST SURGICAL HISTORY: Past Surgical History:  Procedure Laterality Date   CARDIAC CATHETERIZATION N/A 12/14/2015   Procedure: Left Heart Cath and Coronary Angiography;  Surgeon: Runell Gess, MD;  Location: [facility] INVASIVE CV LAB;  Service: Cardiovascular;  Laterality: N/A;   CARDIAC CATHETERIZATION N/A 12/14/2015   Procedure: Coronary Stent Intervention;  Surgeon: Runell Gess, MD;  Location: [facility] INVASIVE CV LAB;  Service: Cardiovascular;  Laterality: N/A;   CORONARY STENT PLACEMENT  12/14/2015   Mid LAD lesion, 70 %stenosed.   SHOULDER SURGERY Left 1998   TOTAL SHOULDER ARTHROPLASTY Left 05/13/2021   Procedure: TOTAL SHOULDER ARTHROPLASTY;  Surgeon: Teryl Lucy, MD;  Location: [facility] ORS;  Service: Orthopedics;  Laterality: Left;   WISDOM TOOTH EXTRACTION      FAMILY HISTORY: Family History  Problem Relation Age of Onset   Heart failure Mother    Hypertension Mother    High blood pressure Mother    Cancer Father    High blood pressure Father     SOCIAL HISTORY: Social History   Socioeconomic History   Marital status: Married    Spouse name: Not on file   Number of children: 1   Years of education: Not on file   Highest education level: Not on file  Occupational History   Not on file  Tobacco Use   Smoking status: Never   Smokeless tobacco: Never  Vaping Use   Vaping Use: Every day  Substance and Sexual Activity   Alcohol use: Not Currently   Drug use: Yes    Comment: HEMP vaping   Sexual activity: Not on file  Other Topics Concern   Not on file  Social History Narrative    Not on file   Social Determinants of Health   Financial Resource Strain: Not on file  Food Insecurity: Not on file  Transportation Needs: Not on file  Physical Activity: Not on file  Stress: Not on file  Social Connections: Not on file  Intimate Partner Violence: Not on file      PHYSICAL EXAM Generalized: Well developed, in no acute distress   Neurological examination  Mentation: Alert oriented to time, place, history taking. Follows all commands speech and language fluent Cranial nerve II-XII:Facial symmetry noted.  Reflexes: UTA  DIAGNOSTIC DATA (LABS, IMAGING, TESTING) - I reviewed patient records, labs, notes, testing and imaging myself where available.  Lab Results  Component Value Date   WBC 6.5 05/11/2021   HGB 16.8 05/11/2021   HCT 47.8 05/11/2021   MCV 88.8 05/11/2021   PLT 176 05/11/2021      Component Value Date/Time   NA 140 05/11/2021 1120   K 4.2 05/11/2021 1120   CL 108 05/11/2021 1120   CO2 24 05/11/2021 1120   GLUCOSE 84 05/11/2021 1120   BUN 19 05/11/2021 1120   CREATININE 1.14 05/11/2021 1120   CREATININE 1.27 12/08/2015 1101   CALCIUM 9.7 05/11/2021 1120   PROT 6.9 02/08/2021 0900   ALBUMIN 4.6 02/08/2021 0900   AST 18 02/08/2021 0900   ALT 29 02/08/2021 0900   ALKPHOS 98 02/08/2021 0900   BILITOT 0.8 02/08/2021 0900   GFRNONAA >60 05/11/2021 1120   GFRNONAA 65 12/08/2015 1101   GFRAA >60 12/15/2015 0225   GFRAA 76 12/08/2015 1101   Lab Results  Component Value Date   CHOL 213 (H) 02/08/2021   HDL 54 02/08/2021   LDLCALC 141 (H) 02/08/2021   LDLDIRECT 168.0 05/23/2008   TRIG 100 02/08/2021   CHOLHDL 3.9 02/08/2021   No results found for: "HGBA1C" No results found for: "VITAMINB12" Lab Results  Component Value Date   TSH 1.00 12/08/2015      ASSESSMENT AND PLAN 57 y.o. year old male  has a past medical history of Anxiety, Anxiety and depression, Arthritis, CAD S/P PCI x 2 to LAD (12/14/2015), Coronary artery disease,  Depression, GERD (gastroesophageal reflux disease), Headache, Hypertension, Laceration of leg not thigh, Migraine, Myocardial infarction (HCC), Progressive angina (HCC) (12/14/2015), and Sleep apnea. here with:  OSA on CPAP  CPAP compliance excellent Residual AHI is good Encouraged patient to continue using CPAP nightly and > 4 hours each night F/U in 1 year or sooner if needed    Butch Penny, MSN, NP-C 03/29/2022, 2:57 PM [facility] [address] [place], [place] [address] [phone]

## 2022-03-29 ENCOUNTER — Telehealth (INDEPENDENT_AMBULATORY_CARE_PROVIDER_SITE_OTHER): Payer: BC Managed Care – PPO | Admitting: Adult Health

## 2022-03-29 DIAGNOSIS — G4733 Obstructive sleep apnea (adult) (pediatric): Secondary | ICD-10-CM | POA: Diagnosis not present

## 2022-04-25 DIAGNOSIS — M9904 Segmental and somatic dysfunction of sacral region: Secondary | ICD-10-CM | POA: Diagnosis not present

## 2022-04-25 DIAGNOSIS — M9903 Segmental and somatic dysfunction of lumbar region: Secondary | ICD-10-CM | POA: Diagnosis not present

## 2022-04-25 DIAGNOSIS — M545 Low back pain, unspecified: Secondary | ICD-10-CM | POA: Diagnosis not present

## 2022-04-25 DIAGNOSIS — M9905 Segmental and somatic dysfunction of pelvic region: Secondary | ICD-10-CM | POA: Diagnosis not present

## 2022-05-02 ENCOUNTER — Encounter: Payer: Self-pay | Admitting: Internal Medicine

## 2022-05-04 ENCOUNTER — Other Ambulatory Visit: Payer: Self-pay | Admitting: Cardiovascular Disease

## 2022-05-04 DIAGNOSIS — I2510 Atherosclerotic heart disease of native coronary artery without angina pectoris: Secondary | ICD-10-CM

## 2022-05-04 DIAGNOSIS — E785 Hyperlipidemia, unspecified: Secondary | ICD-10-CM

## 2022-05-11 DIAGNOSIS — M9905 Segmental and somatic dysfunction of pelvic region: Secondary | ICD-10-CM | POA: Diagnosis not present

## 2022-05-11 DIAGNOSIS — M545 Low back pain, unspecified: Secondary | ICD-10-CM | POA: Diagnosis not present

## 2022-05-11 DIAGNOSIS — M9904 Segmental and somatic dysfunction of sacral region: Secondary | ICD-10-CM | POA: Diagnosis not present

## 2022-05-11 DIAGNOSIS — M9903 Segmental and somatic dysfunction of lumbar region: Secondary | ICD-10-CM | POA: Diagnosis not present

## 2022-05-16 DIAGNOSIS — M545 Low back pain, unspecified: Secondary | ICD-10-CM | POA: Diagnosis not present

## 2022-05-16 DIAGNOSIS — M9904 Segmental and somatic dysfunction of sacral region: Secondary | ICD-10-CM | POA: Diagnosis not present

## 2022-05-16 DIAGNOSIS — M9905 Segmental and somatic dysfunction of pelvic region: Secondary | ICD-10-CM | POA: Diagnosis not present

## 2022-05-16 DIAGNOSIS — M9903 Segmental and somatic dysfunction of lumbar region: Secondary | ICD-10-CM | POA: Diagnosis not present

## 2022-05-25 DIAGNOSIS — F331 Major depressive disorder, recurrent, moderate: Secondary | ICD-10-CM | POA: Diagnosis not present

## 2022-05-25 DIAGNOSIS — F419 Anxiety disorder, unspecified: Secondary | ICD-10-CM | POA: Diagnosis not present

## 2022-05-27 DIAGNOSIS — R7303 Prediabetes: Secondary | ICD-10-CM | POA: Diagnosis not present

## 2022-05-27 DIAGNOSIS — E291 Testicular hypofunction: Secondary | ICD-10-CM | POA: Diagnosis not present

## 2022-05-27 DIAGNOSIS — I10 Essential (primary) hypertension: Secondary | ICD-10-CM | POA: Diagnosis not present

## 2022-07-04 ENCOUNTER — Other Ambulatory Visit: Payer: Self-pay | Admitting: Pharmacist

## 2022-07-04 DIAGNOSIS — E785 Hyperlipidemia, unspecified: Secondary | ICD-10-CM

## 2022-07-04 DIAGNOSIS — I2510 Atherosclerotic heart disease of native coronary artery without angina pectoris: Secondary | ICD-10-CM

## 2022-07-04 MED ORDER — REPATHA SURECLICK 140 MG/ML ~~LOC~~ SOAJ: SUBCUTANEOUS | 11 refills | Status: AC

## 2022-07-13 DIAGNOSIS — F419 Anxiety disorder, unspecified: Secondary | ICD-10-CM | POA: Diagnosis not present

## 2022-07-13 DIAGNOSIS — F331 Major depressive disorder, recurrent, moderate: Secondary | ICD-10-CM | POA: Diagnosis not present

## 2022-07-18 ENCOUNTER — Telehealth: Payer: Self-pay

## 2022-07-18 ENCOUNTER — Other Ambulatory Visit (HOSPITAL_COMMUNITY): Payer: Self-pay

## 2022-07-18 NOTE — Telephone Encounter (Signed)
Pharmacy Patient Advocate Encounter   Received notification from CoverMyMeds that prior authorization for REPATHA is required/requested.   Insurance verification completed.   The patient is insured through [facility] .   Per test claim: [place] to [employer] via CoverMyMeds Key/confirmation #/EOC [id number] Status is pending

## 2022-07-20 NOTE — Telephone Encounter (Signed)
Pharmacy Patient Advocate Encounter  Received notification from [name] that Prior Authorization for REPATHA has been APPROVED from 07/18/22 to 07/18/23.Martin Guzman

## 2022-08-05 ENCOUNTER — Ambulatory Visit: Payer: BC Managed Care – PPO | Admitting: Cardiovascular Disease

## 2022-09-07 NOTE — Progress Notes (Unsigned)
Cardiology Office Note:  .   Date:  09/08/2022  ID:  Martin Guzman, DOB 1965-04-04, MRN [id number] PCP: Soundra Pilon, FNP  [facility] Providers Cardiologist:  Nanetta Batty, MD    History of Present Illness: .   Martin Guzman is a 57 y.o. male with past medical history of hypertension, hyperlipidemia, OSA.  He was first seen by Dr. Allyson Sabal in 12/2015 for recurrent chest pain.  He underwent heart cath at that time that showed mid LAD lesion, 70% stenosis and distal LAD lesion 99% stenosis DES were placed to both areas.  He was lost to follow-up following visit in 2018 at which time he had been stable from a cardiac perspective.  He was seen in office on 08/2020 following 2 syncopal episodes.  It was recommended that he have a repeat echo and wear a 30-day event monitor.  Echo in 09/2020 indicated LVEF of 60 to 65%, LV with normal function, no RWMA.  There was borderline dilation of the ascending aorta measuring 37 mm.  His cardiac monitor showed sinus rhythm, no arrhythmias were noted.  He was last seen by Dr. Allyson Sabal on 02/03/2021 and had remained stable from a cardiac perspective, he denied any further syncopal events.  Today Martin Guzman presents for follow-up, he reports that he is doing well he has no concerns or complaints.  He denies any chest pain, shortness of breath, lower extreme edema or palpitations.  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  He also notes that he tries to follow a heart healthy diet.  Reports that he has been off of his Repatha since July as he was not aware that the prior authorization had been approved he now plans to start his Repatha.  ROS:  Today he denies denies chest pain, shortness of breath, lower extremity edema, fatigue, palpitations, melena, hematuria, hemoptysis, diaphoresis, weakness, presyncope, syncope, orthopnea, and PND.   Studies Reviewed: Marland Kitchen   EKG Interpretation Date/Time:  Thursday September 08 2022  15:26:56 EDT Ventricular Rate:  54 PR Interval:  178 QRS Duration:  96 QT Interval:  438 QTC Calculation: 415 R Axis:   55  Text Interpretation: Sinus bradycardia No acute changes Confirmed by Reather Littler [phone]) on 09/08/2022 3:35:34 PM   Cardiac Studies & Procedures   CARDIAC CATHETERIZATION  CARDIAC CATHETERIZATION 12/14/2015  Narrative Images from the original result were not included.   Mid LAD lesion, 70 %stenosed.  Post intervention, there is a 0% residual stenosis.  A stent was successfully placed.  Dist LAD lesion, 99 %stenosed.  Post intervention, there is a 0% residual stenosis.  A stent was successfully placed.  Martin Guzman is a 57 y.o. male   [id number] LOCATION:  FACILITY: [facility] PHYSICIAN: Nanetta Batty, M.D. [birth year]   DATE OF PROCEDURE:  12/14/2015  DATE OF DISCHARGE:     CARDIAC CATHETERIZATION / PCI/DES    History obtained from chart review. [name] is a 57 year old mildly overweight married Caucasian male father of one 34 year old daughter accompanied by his wife Martin Guzman today. He was referred by Loney Loh as well as his PCP, Dr. Chilton Si for cardiovascular evaluation because of new-onset chest pain. His risk factors include treated hypertension and hyperlipidemia untreated according to the chart. He does have obstructive sleep apnea on sleep. He does not smoke nor is he diabetic. He has a strong family history for heart disease with a mother who has apparently 22 stents by his  account and a brother who had stenting at age 32. New-onset chest pain approximately 4 weeks ago has had 6 episodes since. Currently during times of stress and exercise with left upper extremity radiation. He did have a routine GXT at the hospital by Dr. Chilton Si on 11/2015 which was nonischemic although he developed substernal chest pain relieved with sublingual nitroglycerin. I elected to proceed with outpatient radial diagnostic coronary angiography to define his  anatomy.  Impression Successful mid and distal LAD PCI and drug-eluting stenting using Medtronic Onyx drug-eluting balloons for effort angina. The remainder of his coronary anatomy was free of significant disease. His LV function was normal via noninvasive testing. He will be discharged home in the morning on antibiotic therapy. I will see him back in the office in 2-3 weeks for follow-up.  Nanetta Batty. MD, [facility] 12/14/2015 11:23 AM  Findings Coronary Findings Diagnostic  Dominance: Right  Left Anterior Descending  Intervention  Mid LAD lesion Angioplasty A stent was successfully placed. There is a 0% residual stenosis post intervention.  Dist LAD lesion Angioplasty A stent was successfully placed. There is a 0% residual stenosis post intervention.     ECHOCARDIOGRAM  ECHOCARDIOGRAM COMPLETE 09/14/2020  Narrative ECHOCARDIOGRAM REPORT    Patient Name:   Martin Guzman Date of Exam: 09/14/2020 Medical Rec #:  [id number]       Height:       72.0 in Accession #:    [id number]      Weight:       272.0 lb Date of Birth:  [birth year]        BSA:          2.428 m Patient Age:    55 years        BP:           114/70 mmHg Patient Gender: M               HR:           66 bpm. Exam Location:  [address]  Procedure: 2D Echo, 3D Echo, Cardiac Doppler, Color Doppler and Strain Analysis  Indications:    R55 Syncope  History:        Patient has no prior history of Echocardiogram examinations. CAD, Signs/Symptoms:Syncope, Edema and Chest Pain; Risk Factors:Hypertension, Dyslipidemia, Family History of Coronary Artery Disease and Sleep Apnea.  Sonographer:    na Referring Phys: 70 JONATHAN J BERRY  IMPRESSIONS   1. Left ventricular ejection fraction, by estimation, is 60 to 65%. The left ventricle has normal function. The left ventricle has no regional wall motion abnormalities. Left ventricular diastolic parameters were normal. The average left ventricular global  longitudinal strain is 23.0 %. The global longitudinal strain is normal. 2. Right ventricular systolic function is normal. The right ventricular size is normal. Tricuspid regurgitation signal is inadequate for assessing PA pressure. 3. Left atrial size was mildly dilated. 4. Right atrial size was mildly dilated. 5. The mitral valve is normal in structure. Trivial mitral valve regurgitation. No evidence of mitral stenosis. 6. The aortic valve is tricuspid. Aortic valve regurgitation is not visualized. No aortic stenosis is present. 7. Aortic dilatation noted. There is borderline dilatation of the ascending aorta, measuring 37 mm. 8. The inferior vena cava is normal in size with greater than 50% respiratory variability, suggesting right atrial pressure of 3 mmHg.  FINDINGS Left Ventricle: Left ventricular ejection fraction, by estimation, is 60 to 65%. The left ventricle has normal function. The left ventricle has  no regional wall motion abnormalities. The average left ventricular global longitudinal strain is 23.0 %. The global longitudinal strain is normal. The left ventricular internal cavity size was normal in size. There is no left ventricular hypertrophy. Left ventricular diastolic parameters were normal.  Right Ventricle: The right ventricular size is normal. No increase in right ventricular wall thickness. Right ventricular systolic function is normal. Tricuspid regurgitation signal is inadequate for assessing PA pressure.  Left Atrium: Left atrial size was mildly dilated.  Right Atrium: Right atrial size was mildly dilated.  Pericardium: There is no evidence of pericardial effusion.  Mitral Valve: The mitral valve is normal in structure. Trivial mitral valve regurgitation. No evidence of mitral valve stenosis.  Tricuspid Valve: The tricuspid valve is normal in structure. Tricuspid valve regurgitation is not demonstrated.  Aortic Valve: The aortic valve is tricuspid. Aortic valve  regurgitation is not visualized. No aortic stenosis is present.  Pulmonic Valve: The pulmonic valve was normal in structure. Pulmonic valve regurgitation is not visualized.  Aorta: Aortic dilatation noted. There is borderline dilatation of the ascending aorta, measuring 37 mm.  Venous: The inferior vena cava is normal in size with greater than 50% respiratory variability, suggesting right atrial pressure of 3 mmHg.  IAS/Shunts: No atrial level shunt detected by color flow Doppler.   LEFT VENTRICLE PLAX 2D LVIDd:         5.30 cm  Diastology LVIDs:         3.50 cm  LV e' medial:    11.35 cm/s LV PW:         0.70 cm  LV E/e' medial:  7.9 LV IVS:        0.80 cm  LV e' lateral:   12.95 cm/s LVOT diam:     2.00 cm  LV E/e' lateral: 6.9 LV SV:         80 LV SV Index:   33       2D Longitudinal Strain LVOT Area:     3.14 cm 2D Strain GLS (A2C):   22.4 % 2D Strain GLS (A3C):   25.5 % 2D Strain GLS (A4C):   21.1 % 2D Strain GLS Avg:     23.0 %  3D Volume EF: 3D EF:        67 % LV EDV:       188 ml LV ESV:       62 ml LV SV:        126 ml  RIGHT VENTRICLE TAPSE (M-mode): 2.4 cm  LEFT ATRIUM             Index       RIGHT ATRIUM           Index LA diam:        4.60 cm 1.89 cm/m  RA Area:     19.80 cm LA Vol (A2C):   82.6 ml 34.02 ml/m RA Volume:   51.70 ml  21.29 ml/m LA Vol (A4C):   93.1 ml 38.34 ml/m LA Biplane Vol: 88.7 ml 36.53 ml/m AORTIC VALVE LVOT Vmax:   123.50 cm/s LVOT Vmean:  77.050 cm/s LVOT VTI:    0.255 m  AORTA Ao Root diam: 3.40 cm Ao Asc diam:  3.70 cm  MITRAL VALVE MV Area (PHT): cm         SHUNTS MV Decel Time: 213 msec    Systemic VTI:  0.26 m MV E velocity: 89.85 cm/s  Systemic Diam: 2.00 cm MV A velocity: 71.30  cm/s MV E/A ratio:  1.26  Dalton McleanMD Electronically signed by Wilfred Lacy Signature Date/Time: 09/14/2020/5:48:55 PM    Final    MONITORS  CARDIAC EVENT MONITOR 10/13/2020  Narrative 1. SR/SB/ST 2. No arrhythmia  noted                    Physical Exam:   VS:  BP 116/62 (BP Location: Left Arm, Patient Position: Sitting, Cuff Size: Normal)   Pulse (!) 54   Ht 6' (1.829 m)   Wt 267 lb 12.8 oz (121.5 kg)   SpO2 98%   BMI 36.32 kg/m    Wt Readings from Last 3 Encounters:  09/08/22 267 lb 12.8 oz (121.5 kg)  05/13/21 274 lb (124.3 kg)  05/11/21 274 lb (124.3 kg)    GEN: Well nourished, well developed in no acute distress NECK: No JVD; No carotid bruits CARDIAC: RRR, no murmurs, rubs, gallops RESPIRATORY:  Clear to auscultation without rales, wheezing or rhonchi  ABDOMEN: Soft, non-tender, non-distended EXTREMITIES:  No edema; No deformity     ASSESSMENT AND PLAN: .   CAD: S/p DES to mid and distla LAD in 12/2015. Stable with no anginal symptoms. No indication for ischemic evaluation.  Heart healthy diet and regular cardiovascular exercise encouraged.  Continue aspirin, Lipitor, Plavix, Repatha, lisinopril and metoprolol.  Hypertension: Blood pressure well controlled today at 116/62.  Continue lisinopril 40 mg daily.  Hyperlipidemia: Last lipid profile on 08/27/2021 indicated total cholesterol 75, triglycerides 243 and LDL of 4.  He was previously started on Repatha by lipid clinic.  Patient reports that he had stopped Repatha in July as he was under the impression that he was still waiting for a prior authorization.  On chart review patient has prior authorization he will reach out to his pharmacy in order to restart the Repatha.  He has his annual physical planned with his PCP in the next few weeks and will have his labs checked at that time.  Continue Repatha.  OSA: Patient reports compliance with CPAP.       Dispo: Follow up with Dr. Allyson Sabal in one year.   Signed, Rip Harbour, NP

## 2022-09-08 ENCOUNTER — Ambulatory Visit: Payer: BC Managed Care – PPO | Attending: Cardiovascular Disease | Admitting: Cardiology

## 2022-09-08 ENCOUNTER — Encounter: Payer: Self-pay | Admitting: General Practice

## 2022-09-08 VITALS — BP 116/62 | HR 54 | Ht 72.0 in | Wt 267.8 lb

## 2022-09-08 DIAGNOSIS — G4733 Obstructive sleep apnea (adult) (pediatric): Secondary | ICD-10-CM

## 2022-09-08 DIAGNOSIS — Z9861 Coronary angioplasty status: Secondary | ICD-10-CM | POA: Diagnosis not present

## 2022-09-08 DIAGNOSIS — I10 Essential (primary) hypertension: Secondary | ICD-10-CM

## 2022-09-08 DIAGNOSIS — E785 Hyperlipidemia, unspecified: Secondary | ICD-10-CM

## 2022-09-08 DIAGNOSIS — I2510 Atherosclerotic heart disease of native coronary artery without angina pectoris: Secondary | ICD-10-CM

## 2022-09-08 NOTE — Patient Instructions (Signed)
Medication Instructions:  The current medical regimen is effective;  continue present plan and medications as directed. Please refer to the Current Medication list given to you today.  *If you need a refill on your cardiac medications before your next appointment, please call your pharmacy*  Lab Work: NONE If you have labs (blood work) drawn today and your tests are completely normal, you will receive your results only by: MyChart Message (if you have MyChart) OR A paper copy in the mail If you have any lab test that is abnormal or we need to change your treatment, we will call you to review the results.  Testing/Procedures: NONE  Follow-Up: At [facility], you and your health needs are our priority.  As part of our continuing mission to provide you with exceptional heart care, we have created designated Provider Care Teams.  These Care Teams include your primary Cardiologist (physician) and Advanced Practice Providers (APPs -  Physician Assistants and Nurse Practitioners) who all work together to provide you with the care you need, when you need it.  Your next appointment:   12 month(s)  Provider:   Nanetta Batty, MD

## 2022-09-14 DIAGNOSIS — H5213 Myopia, bilateral: Secondary | ICD-10-CM | POA: Diagnosis not present

## 2022-09-15 DIAGNOSIS — E559 Vitamin D deficiency, unspecified: Secondary | ICD-10-CM | POA: Diagnosis not present

## 2022-09-15 DIAGNOSIS — I10 Essential (primary) hypertension: Secondary | ICD-10-CM | POA: Diagnosis not present

## 2022-09-15 DIAGNOSIS — E291 Testicular hypofunction: Secondary | ICD-10-CM | POA: Diagnosis not present

## 2022-09-15 DIAGNOSIS — E782 Mixed hyperlipidemia: Secondary | ICD-10-CM | POA: Diagnosis not present

## 2022-09-15 DIAGNOSIS — R7303 Prediabetes: Secondary | ICD-10-CM | POA: Diagnosis not present

## 2022-09-22 DIAGNOSIS — Z0000 Encounter for general adult medical examination without abnormal findings: Secondary | ICD-10-CM | POA: Diagnosis not present

## 2022-09-22 DIAGNOSIS — R7303 Prediabetes: Secondary | ICD-10-CM | POA: Diagnosis not present

## 2022-09-22 DIAGNOSIS — E782 Mixed hyperlipidemia: Secondary | ICD-10-CM | POA: Diagnosis not present

## 2022-09-22 DIAGNOSIS — G43111 Migraine with aura, intractable, with status migrainosus: Secondary | ICD-10-CM | POA: Diagnosis not present

## 2022-09-22 DIAGNOSIS — I10 Essential (primary) hypertension: Secondary | ICD-10-CM | POA: Diagnosis not present

## 2022-10-10 ENCOUNTER — Other Ambulatory Visit (HOSPITAL_COMMUNITY): Payer: Self-pay

## 2022-10-10 ENCOUNTER — Telehealth: Payer: Self-pay | Admitting: Cardiovascular Disease

## 2022-10-10 ENCOUNTER — Telehealth: Payer: Self-pay | Admitting: Pharmacy Technician

## 2022-10-10 NOTE — Telephone Encounter (Signed)
. *  STAT* If patient is at the pharmacy, call can be transferred to refill team.   1. Which medications need to be refilled? (please list name of each medication and dose if known) prior authorization for Repatha   2. Would you like to learn more about the convenience, safety, & potential cost savings by using the [facility]?     3. Are you open to using the [facility] (Type [facility].    4. Which pharmacy/location (including street and city if local pharmacy) is medication to be sent to?  [facility] [address] [place], [place]   5. Do they need a 30 day or 90 day supply?

## 2022-10-10 NOTE — Telephone Encounter (Signed)
Pharmacy Patient Advocate Encounter  Insurance verification completed.    The patient is insured through [facility]   Ran test claim for repatha. Currently a quantity of 2 ML is a 28 day supply and the co-pay is 35.00 . No prior auth needed at this time   This test claim was processed through [facility]- copay amounts may vary at other pharmacies due to pharmacy/plan contracts, or as the patient moves through the different stages of their insurance plan.

## 2022-10-18 DIAGNOSIS — F419 Anxiety disorder, unspecified: Secondary | ICD-10-CM | POA: Diagnosis not present

## 2022-10-18 DIAGNOSIS — F331 Major depressive disorder, recurrent, moderate: Secondary | ICD-10-CM | POA: Diagnosis not present

## 2023-03-03 IMAGING — DX DG SHOULDER 1V*L*
2 series · 2 of 2 positions shown · non-contrast
Comparison: 12/21/2020

CLINICAL DATA: Left shoulder arthroplasty

EXAM:
LEFT SHOULDER

[shoulder ap (1 of 2)]
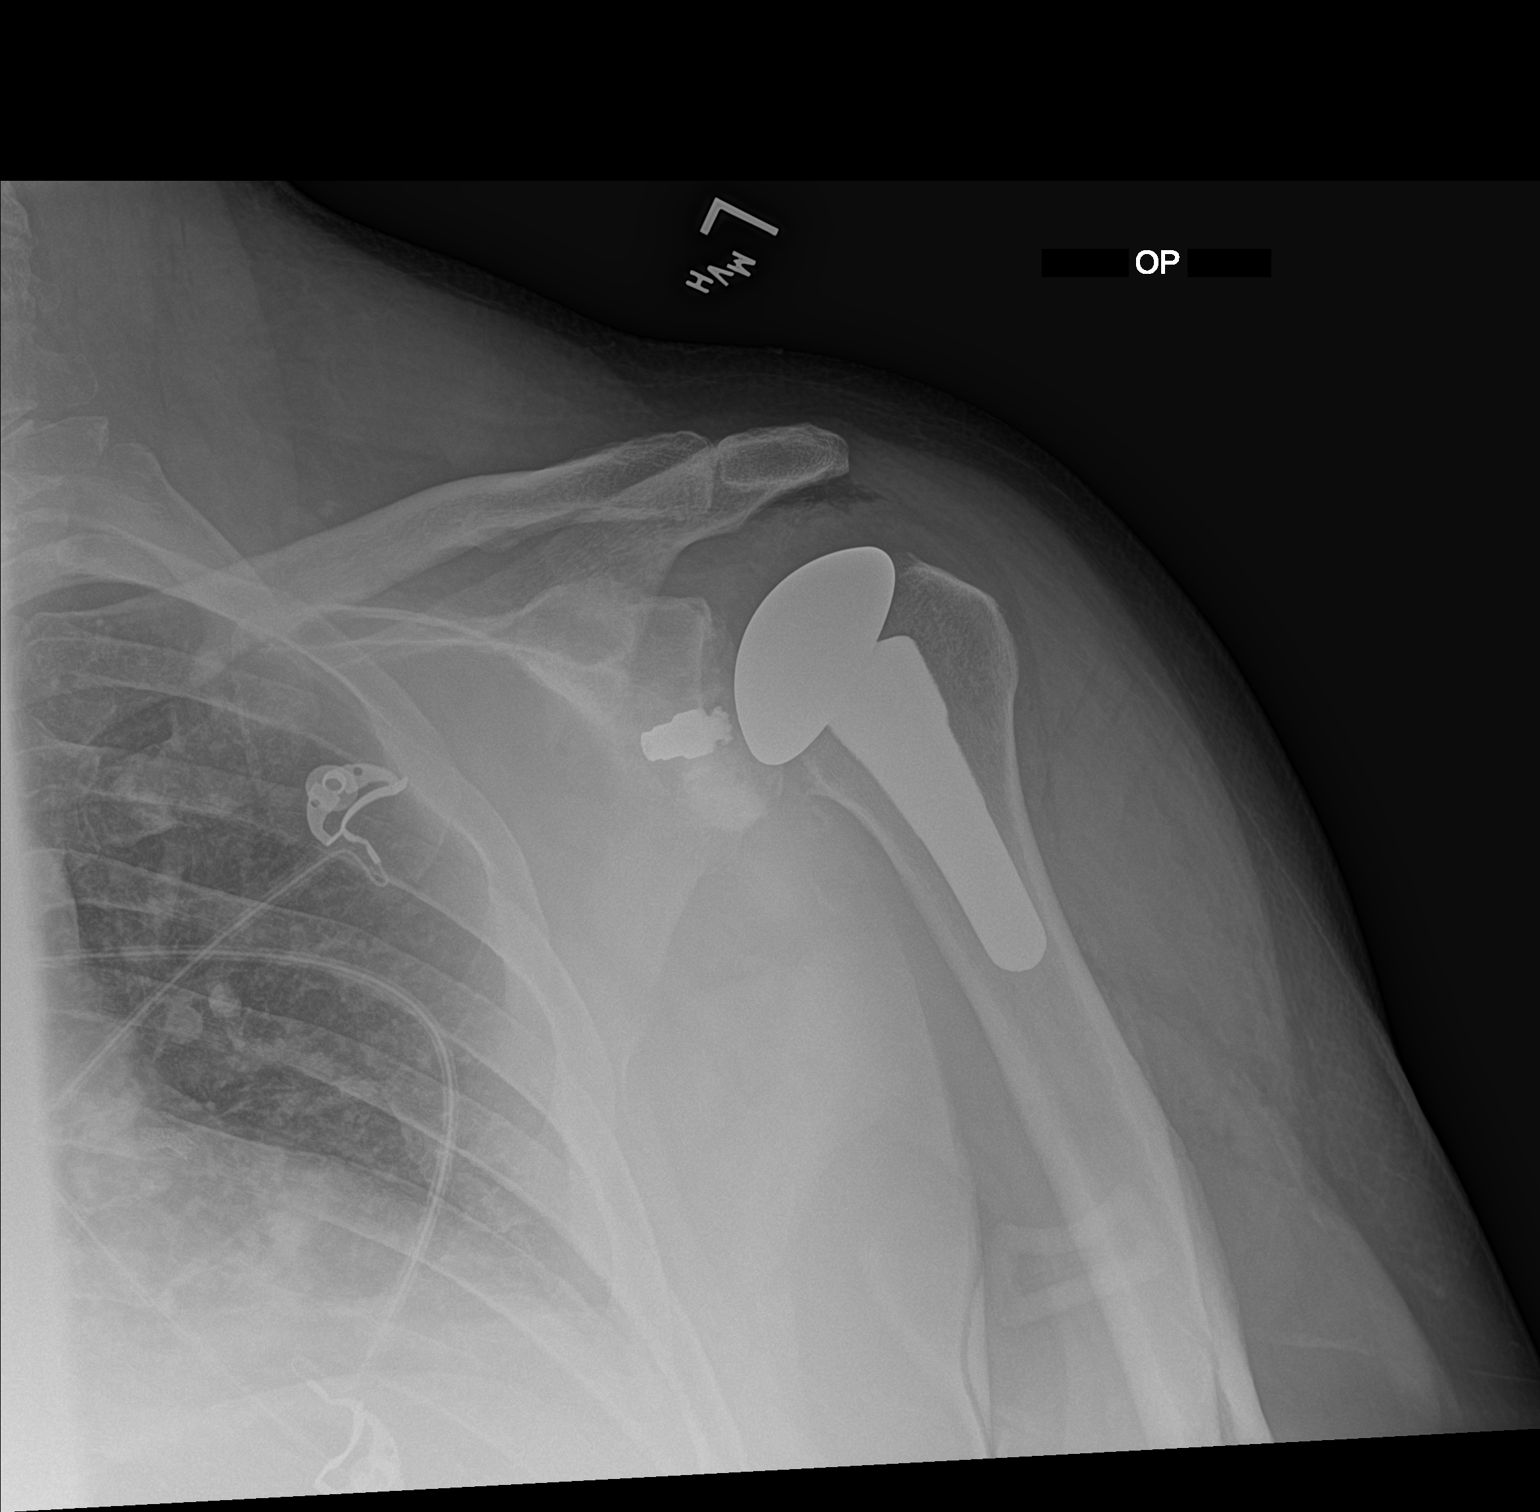

[shoulder ap (2 of 2)]
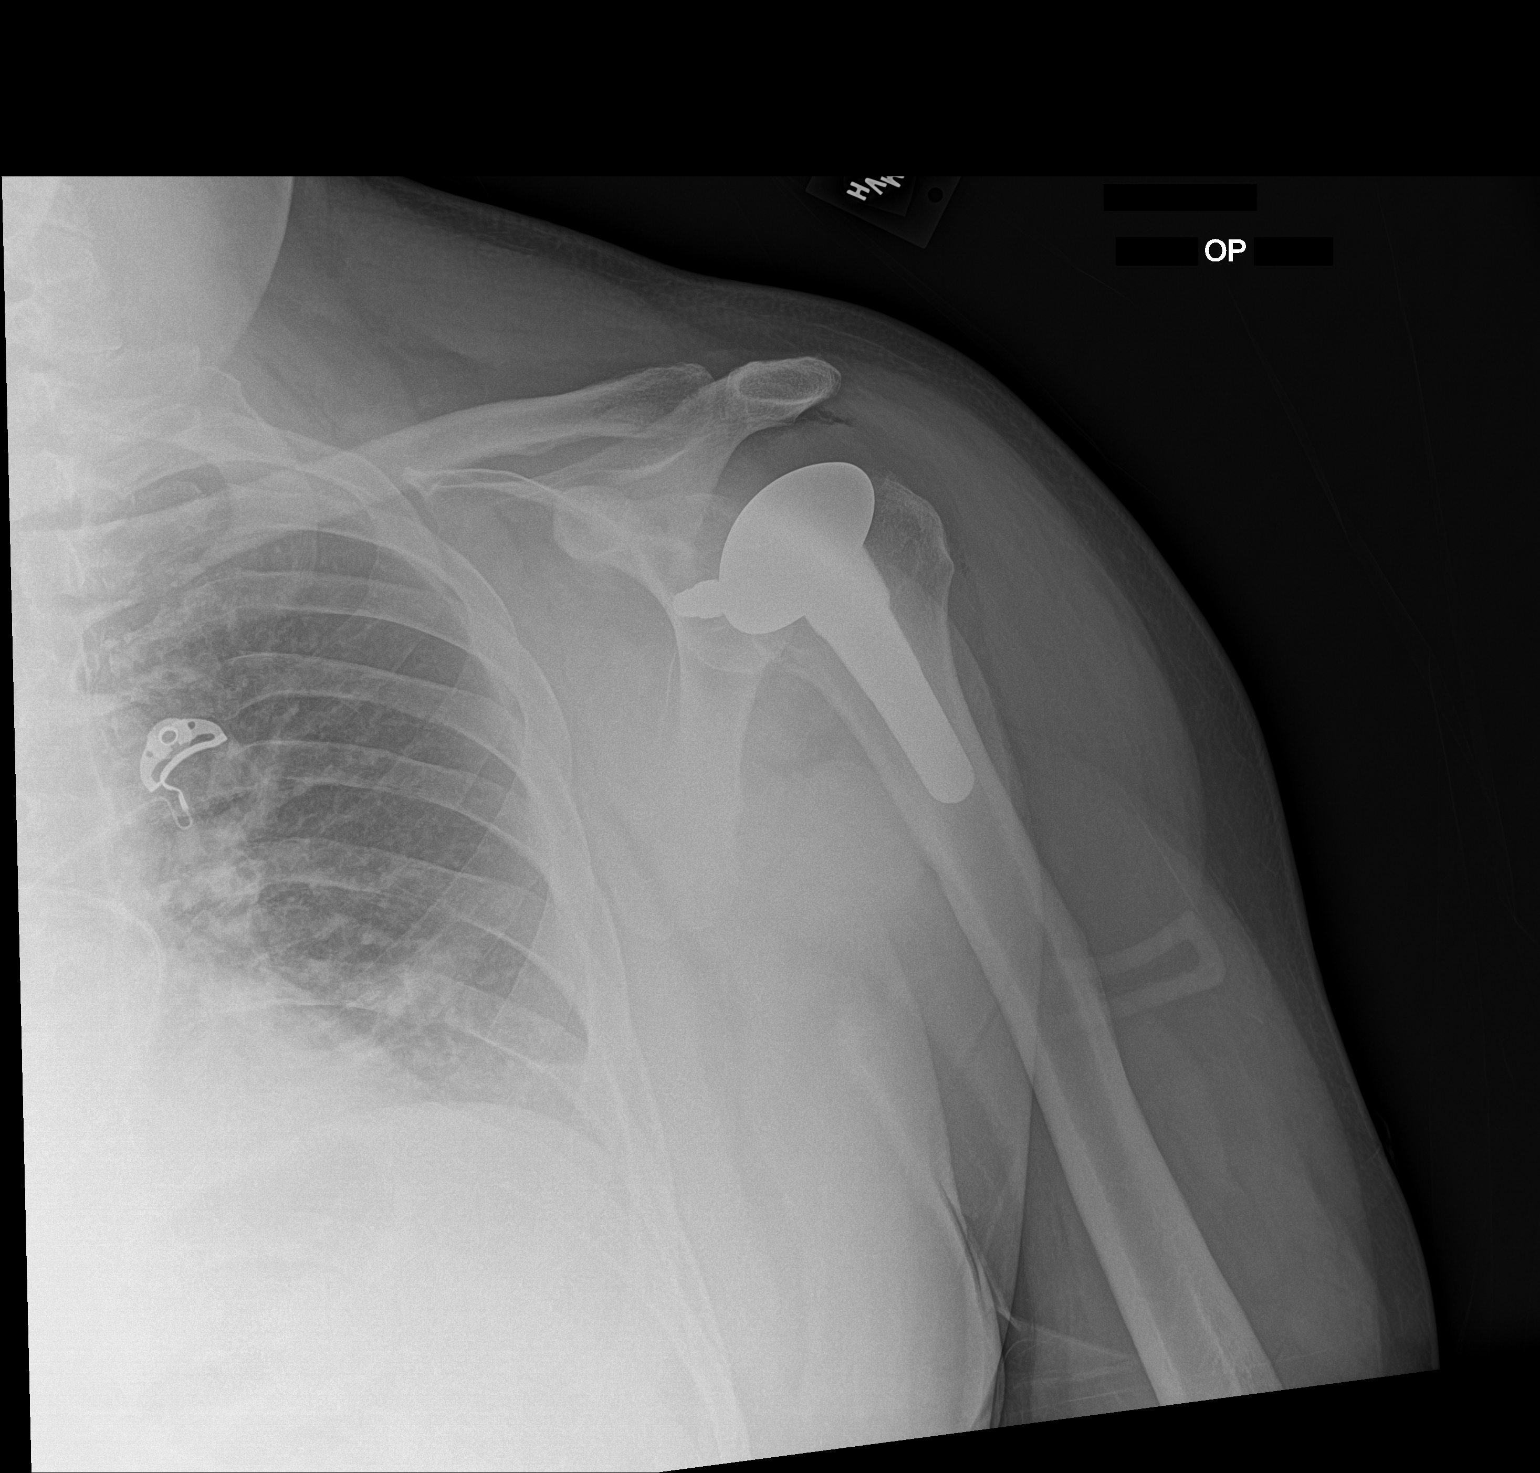

[2 of 2 positions shown; findings below may reference images not displayed]

FINDINGS: Left shoulder replacement in satisfactory position alignment. No
fracture or complication.
IMPRESSION: Satisfactory left shoulder replacement

## 2023-04-10 ENCOUNTER — Telehealth: Payer: Self-pay

## 2023-04-10 NOTE — Telephone Encounter (Signed)
Please See Mychart message.

## 2023-04-10 NOTE — Progress Notes (Deleted)
 Martin Guzman

## 2023-04-11 ENCOUNTER — Telehealth: Payer: BC Managed Care – PPO | Admitting: Adult Health

## 2023-05-03 DIAGNOSIS — F331 Major depressive disorder, recurrent, moderate: Secondary | ICD-10-CM | POA: Diagnosis not present

## 2023-05-03 DIAGNOSIS — F419 Anxiety disorder, unspecified: Secondary | ICD-10-CM | POA: Diagnosis not present

## 2023-06-14 DIAGNOSIS — Z5181 Encounter for therapeutic drug level monitoring: Secondary | ICD-10-CM | POA: Diagnosis not present

## 2023-06-14 DIAGNOSIS — F331 Major depressive disorder, recurrent, moderate: Secondary | ICD-10-CM | POA: Diagnosis not present

## 2023-06-14 DIAGNOSIS — F419 Anxiety disorder, unspecified: Secondary | ICD-10-CM | POA: Diagnosis not present

## 2023-07-28 DIAGNOSIS — F331 Major depressive disorder, recurrent, moderate: Secondary | ICD-10-CM | POA: Diagnosis not present

## 2023-07-28 DIAGNOSIS — F419 Anxiety disorder, unspecified: Secondary | ICD-10-CM | POA: Diagnosis not present

## 2023-08-03 DIAGNOSIS — F419 Anxiety disorder, unspecified: Secondary | ICD-10-CM | POA: Diagnosis not present

## 2023-08-03 DIAGNOSIS — F331 Major depressive disorder, recurrent, moderate: Secondary | ICD-10-CM | POA: Diagnosis not present

## 2023-11-09 DIAGNOSIS — Z125 Encounter for screening for malignant neoplasm of prostate: Secondary | ICD-10-CM | POA: Diagnosis not present

## 2023-11-09 DIAGNOSIS — E559 Vitamin D deficiency, unspecified: Secondary | ICD-10-CM | POA: Diagnosis not present

## 2023-11-09 DIAGNOSIS — Z0000 Encounter for general adult medical examination without abnormal findings: Secondary | ICD-10-CM | POA: Diagnosis not present

## 2023-11-09 DIAGNOSIS — E782 Mixed hyperlipidemia: Secondary | ICD-10-CM | POA: Diagnosis not present

## 2023-11-09 DIAGNOSIS — K219 Gastro-esophageal reflux disease without esophagitis: Secondary | ICD-10-CM | POA: Diagnosis not present

## 2023-11-09 DIAGNOSIS — R7303 Prediabetes: Secondary | ICD-10-CM | POA: Diagnosis not present

## 2023-11-09 DIAGNOSIS — G43111 Migraine with aura, intractable, with status migrainosus: Secondary | ICD-10-CM | POA: Diagnosis not present

## 2023-11-09 DIAGNOSIS — I10 Essential (primary) hypertension: Secondary | ICD-10-CM | POA: Diagnosis not present

## 2023-11-09 DIAGNOSIS — E291 Testicular hypofunction: Secondary | ICD-10-CM | POA: Diagnosis not present

## 2023-11-15 DIAGNOSIS — F331 Major depressive disorder, recurrent, moderate: Secondary | ICD-10-CM | POA: Diagnosis not present

## 2023-11-15 DIAGNOSIS — F419 Anxiety disorder, unspecified: Secondary | ICD-10-CM | POA: Diagnosis not present

## 2023-11-20 ENCOUNTER — Telehealth: Payer: Self-pay | Admitting: Adult Health

## 2023-11-20 NOTE — Telephone Encounter (Signed)
 Spoke to patient made follow up appointment 12/2023 with Dr Buck . Pt aware to bring pap machine and powecord Pt states pap machine showing message exceeded motor life

## 2023-11-20 NOTE — Telephone Encounter (Signed)
 Pt called to request to speak to Nurse about getting new Cpap Machine  and supplies , Informed Pt that he may need to scheduled appt due to not being seen since 2024 the patient stated he would rather hear that from MD  , Pt is requesting to speak to Nurse or MdD

## 2023-11-24 DIAGNOSIS — I10 Essential (primary) hypertension: Secondary | ICD-10-CM | POA: Diagnosis not present

## 2023-11-24 DIAGNOSIS — E782 Mixed hyperlipidemia: Secondary | ICD-10-CM | POA: Diagnosis not present

## 2023-11-24 DIAGNOSIS — G43111 Migraine with aura, intractable, with status migrainosus: Secondary | ICD-10-CM | POA: Diagnosis not present

## 2023-11-24 DIAGNOSIS — R7303 Prediabetes: Secondary | ICD-10-CM | POA: Diagnosis not present

## 2023-12-13 ENCOUNTER — Encounter: Payer: Self-pay | Admitting: Neurology

## 2023-12-13 ENCOUNTER — Ambulatory Visit: Admitting: Neurology

## 2023-12-13 VITALS — BP 104/63 | HR 62 | Ht 72.0 in | Wt 258.0 lb

## 2023-12-13 DIAGNOSIS — R634 Abnormal weight loss: Secondary | ICD-10-CM | POA: Diagnosis not present

## 2023-12-13 DIAGNOSIS — G4733 Obstructive sleep apnea (adult) (pediatric): Secondary | ICD-10-CM | POA: Diagnosis not present

## 2023-12-13 NOTE — Progress Notes (Addendum)
 Subjective:    Patient ID: Martin Guzman is a 58 y.o. male.  HPI    True Mar, MD, PhD [facility] [address] [address] [place], [place] [address]   Mr. Martin Guzman is a 58 year old male with an underlying medical history of coronary artery disease, status post stent placement, depression, anxiety, reflux disease, hypertension, headaches, restless leg syndrome, and obesity, who presents for follow-up consultation of his obstructive sleep apnea, well-established on PAP therapy.  The patient is unaccompanied today. He was last seen in a video visit by Duwaine Russell, NP in March 2024, at which time he was compliant with his CPAP of 10 cm with EPR of 3.  He missed an appointment for this year.  Today, 12/13/2023: I reviewed his CPAP compliance data from 11/12/2023 through 12/11/2023, which is a total of 30 days, during which time he used his machine every night with percent use days greater than 4 hours at 83%, indicating very good compliance with an average usage of 5 hours and 57 minutes, residual AHI at goal at 2/h, leak on the higher side with the 95th percentile at 28.7 L/min on a pressure of 10 cm with EPR of 3.  He has seen an error message on his machine that the motor life has been exceeded.  His set up date on this machine was 02/26/2016.  He has not had a sleep study in years.  He reports ongoing compliance with treatments and reports that his machine still works but he would like to get a new machine.  His Epworth sleepiness score is 9 out of 24, fatigue severity score is 37 out of 63.  Bedtime is around 9 and rise time is around 4:50 AM.  He does not drink caffeine daily, no alcohol, he vapes daily, does not smoke cigarettes.  He has nocturia about once or twice per average night and denies recurrent nocturnal or morning headaches.  He has been working on weight loss.   Addendum, 12/21/2023: The patient has had major weight fluctuation over the past few  years.  Sleep testing was over 17 years ago and he was diagnosed with severe obstructive sleep apnea but he weighed well over 300 pounds at the time.  He would benefit from reevaluation with a home sleep test given that he previously had critical desaturations and these need to be rechecked especially in the context of major weight loss.  I will submit for authorization for a home sleep test which previously has been denied but it is clinically necessary and indicated.  Current weight is 258 pounds.  Previously:  03/29/2022 (VV, Duwaine Russell, NP): <<Martin Guzman is a 58 y.o. male with a history of OSA on CPAP. Returns today for follow-up.  Reports that CPAP is working well for him.  He does not like to sleep without it.  His download is below>>  03/25/2021 (VV, Duwaine Russell, NP): <<Mr. Martin Guzman is a 58 year old male with a history of obstructive sleep apnea on CPAP.  He returns today for follow-up.  Reorts that the CPAP is working well for him.  Denies any new issues>>  02/03/2020: (He) was previously diagnosed with obstructive sleep apnea and placed on CPAP therapy.  I reviewed your office note from 12/17/2019.  He had blood work at the time including CMP, TSH, A1c, testosterone  level, vitamin D.  I was able to review the results.  A1c was 5.6, testosterone  380.8, BMP unremarkable, liver function unremarkable, vitamin D low at  21.4.  I was also able to review his prior sleep study from 08/30/2006.  He had severe obstructive sleep apnea with an AHI of 73.6/h, O2 nadir 63%.  He has been on CPAP therapy for years.  He is on his third machine.  His DME company is [facility].  He uses nasal pillows.  He is compliant with treatment and reports benefit from his CPAP.  He denies any residual snoring from it.  He does not have any significant restless leg symptoms at this time and reports that his wife used to have more problems with it and when they slept in the same bed, her restlessness would be  disruptive to their sleep.  They do sleep in separate bedrooms now.  He goes to bed around 9 and rise time is around 6. He used to be followed by [facility] but is not sure when he last had an appointment with a sleep specialist.  He has had occasional morning headaches and takes Tylenol  as needed.  He does not drink caffeine daily.  He does not have night to night nocturia.  He reports a family history of sleep apnea affecting her sister who also has a CPAP machine.  He lives with his wife and 67 year old daughter.  He works as a psychologist, occupational.  He works from 7 AM to 3:30 PM.  He has lost some weight in the recent past.  He has 4 dogs and 1 cat in the household.  He does have daytime somnolence and reports that he does not tend to eat at lunchtime at work but he uses his lunchtime to take a quick nap, they have a couch at work and he naps on a during lunchtime.  He is followed by psychiatry and sees them typically every 3 months.  He is on several medications including potentially sedating medications.  He has been on alprazolam  as needed, Elavil, Abilify, BuSpar, and long-acting fluvoxamine. His Epworth sleepiness score is 16 out of 24, fatigue severity score is 53 out of 63.  He avoids long distance driving and when he has to be driven to a place of work, he reports that someone else typically drives.  I reviewed his CPAP compliance data from the past month, starting 01/05/2020.  He has used his machine with the exception of 2 nights.  His compliance for more than 4 hours is nearly 90%.  Average usage of 7 hours and 41 minutes, residual AHI at goal at 0.7/h, leak acceptable with a 95th percentile at 14.6 L/min on a pressure of 10 cm with EPR of 3.   His Past Medical History Is Significant For: Past Medical History:  Diagnosis Date   Anxiety    Anxiety and depression    Arthritis    CAD S/P PCI x 2 to LAD 12/14/2015   Mid LAD lesion, 70 %stenosed. --> (Resolute Onyx 3.0 x15), Dist LAD lesion, 99  %stenosed --> (Resolute Onyx 2.25x15)    Coronary artery disease    Depression    GERD (gastroesophageal reflux disease)    Headache    Hypertension    Laceration of leg not thigh    Migraine    Myocardial infarction [facility])    Progressive angina (HCC) 12/14/2015   Cardiac Cath with m & d LAD lesions - PCI x 2 DES stents   Sleep apnea     His Past Surgical History Is Significant For: Past Surgical History:  Procedure Laterality Date   CARDIAC CATHETERIZATION N/A  12/14/2015   Procedure: Left Heart Cath and Coronary Angiography;  Surgeon: Dorn JINNY Lesches, MD;  Location: [facility] INVASIVE CV LAB;  Service: Cardiovascular;  Laterality: N/A;   CARDIAC CATHETERIZATION N/A 12/14/2015   Procedure: Coronary Stent Intervention;  Surgeon: Dorn JINNY Lesches, MD;  Location: [facility] INVASIVE CV LAB;  Service: Cardiovascular;  Laterality: N/A;   CORONARY STENT PLACEMENT  12/14/2015   Mid LAD lesion, 70 %stenosed.   SHOULDER SURGERY Left 1998   TOTAL SHOULDER ARTHROPLASTY Left 05/13/2021   Procedure: TOTAL SHOULDER ARTHROPLASTY;  Surgeon: Josefina Chew, MD;  Location: [facility];  Service: Orthopedics;  Laterality: Left;   WISDOM TOOTH EXTRACTION      His Family History Is Significant For: Family History  Problem Relation Age of Onset   Heart failure Mother    Hypertension Mother    High blood pressure Mother    Cancer Father    High blood pressure Father    Sleep apnea Father    Sleep apnea Sister     His Social History Is Significant For: Social History   Socioeconomic History   Marital status: Married    Spouse name: Not on file   Number of children: 1   Years of education: Not on file   Highest education level: Not on file  Occupational History   Not on file  Tobacco Use   Smoking status: Never   Smokeless tobacco: Never  Vaping Use   Vaping status: Every Day  Substance and Sexual Activity   Alcohol use: Not Currently   Drug use: Yes    Comment: HEMP vaping   Sexual activity: Not on  file  Other Topics Concern   Not on file  Social History Narrative   Pt lives with family    Pt works    Social Drivers of Corporate Investment Banker Strain: Not on Ship Broker Insecurity: Not on file  Transportation Needs: Not on file  Physical Activity: Not on file  Stress: Not on file  Social Connections: Not on file    His Allergies Are:  Allergies  Allergen Reactions   Shrimp Extract Shortness Of Breath    Patient reports feeling woozy like after drinking Alcohol   Bee Venom Other (See Comments)   Shellfish Allergy Other (See Comments)    Lip numbing.   :   His Current Medications Are:  Outpatient Encounter Medications as of 12/13/2023  Medication Sig   ALPRAZolam  (XANAX ) 0.25 MG tablet Take 0.25 mg by mouth at bedtime as needed for anxiety.    amitriptyline (ELAVIL) 10 MG tablet Take 10 mg by mouth at bedtime.   ARIPiprazole (ABILIFY) 5 MG tablet Take 5 mg by mouth daily.   aspirin  81 MG tablet Take 81 mg by mouth daily.   atorvastatin  (LIPITOR ) 80 MG tablet Take 1 tablet (80 mg total) by mouth daily at 10 pm.   baclofen  (LIORESAL ) 10 MG tablet Take 1 tablet (10 mg total) by mouth 3 (three) times daily. As needed for muscle spasm   busPIRone (BUSPAR) 15 MG tablet Take 15 mg by mouth 2 (two) times daily.   Cholecalciferol (VITAMIN D) 2000 units tablet Take 2,000 Units by mouth daily.   clopidogrel  (PLAVIX ) 75 MG tablet Take 1 tablet (75 mg total) by mouth daily with breakfast.   Evolocumab  (REPATHA  SURECLICK) 140 MG/ML SOAJ INJECT 140 MG INTO THE SKIN EVERY 14 DAYS   Fluvoxamine Maleate 150 MG CP24 Take 150 mg by mouth daily.   lisinopril  (PRINIVIL ,ZESTRIL )  40 MG tablet Take 40 mg by mouth daily.   metoprolol  succinate (TOPROL -XL) 50 MG 24 hr tablet Take 50 mg by mouth daily. Take with or immediately following a meal.   nitroGLYCERIN  (NITROSTAT ) 0.4 MG SL tablet Place 0.4 mg under the tongue every 5 (five) minutes as needed for chest pain.   ondansetron  (ZOFRAN ) 4  MG tablet Take 1 tablet (4 mg total) by mouth every 8 (eight) hours as needed for nausea or vomiting.   oxyCODONE  (ROXICODONE ) 5 MG immediate release tablet Take 1 tablet (5 mg total) by mouth every 4 (four) hours as needed for severe pain.   promethazine (PHENERGAN) 25 MG tablet Take 25 mg by mouth every 6 (six) hours as needed for vomiting or nausea.   sennosides-docusate sodium  (SENOKOT-S) 8.6-50 MG tablet Take 2 tablets by mouth daily.   SUMAtriptan (IMITREX) 100 MG tablet Take 100 mg by mouth every 2 (two) hours as needed for migraine.   testosterone  cypionate (DEPOTESTOSTERONE CYPIONATE) 200 MG/ML injection Inject 200 mg into the muscle every 14 (fourteen) days.   WEGOVY 1.7 MG/0.75ML SOAJ Inject 1.7 mg into the skin once a week.   No facility-administered encounter medications on file as of 12/13/2023.  :   Review of Systems:  Out of a complete 14 point review of systems, all are reviewed and negative with the exception of these symptoms as listed below:  Review of Systems  Objective:  Neurological Exam  Physical Exam Physical Examination:   Vitals:   12/13/23 1535  BP: 104/63  Pulse: 62    General Examination: The patient is a very pleasant 58 y.o. male in no acute distress. He appears well-developed and well-nourished and well groomed.   HEENT: Normocephalic, atraumatic, pupils are equal, round and reactive to light.  He wears corrective eyeglasses.  Extraocular tracking is well preserved, hearing is grossly intact.  Airway examination reveals mild to moderate mouth dryness, adequate dental hygiene, tongue protrudes centrally and palate elevates symmetrically.  Moderate airway crowding is noted secondary to widened uvula, smaller tonsils noted, Mallampati class III, neck circumference 18 three-quarter inches.      Chest: Clear to auscultation without wheezing, rhonchi or crackles noted.   Heart: S1+S2+0, regular and normal without murmurs, rubs or gallops noted.     Abdomen: Soft, non-tender and non-distended with normal bowel sounds appreciated on auscultation.   Extremities: There is trace to 1+ pitting edema in the distal lower extremities bilaterally.    Skin: Warm and dry without trophic changes noted. There are no varicose veins.   Musculoskeletal: exam reveals no obvious joint deformities.    Neurologically:  Mental status: The patient is awake, alert and oriented in all 4 spheres. His immediate and remote memory, attention, language skills and fund of knowledge are appropriate. There is no evidence of aphasia, agnosia, apraxia or anomia. Speech is clear with normal prosody and enunciation. Thought process is linear. Mood is normal and affect is normal.  Cranial nerves II - XII are as described above under HEENT exam.  Motor exam: Normal bulk, moving all 4 extremities without restriction, no obvious resting or action tremor.  Fine motor skills and coordination: Grossly intact.  Cerebellar testing shows no dysmetria or intention tremor.  No gait ataxia.   Sensory exam: intact to light touch in the upper and lower extremities.  Gait, station and balance: He stands without difficulty, he walks without problems, posture is age-appropriate.    Assessment and Plan:    In summary, Rommie Dunn  Lattner is a 58 year old male with an underlying medical history of coronary artery disease, status post stent placement, depression, anxiety, reflux disease, hypertension, headaches, restless leg syndrome, and obesity, who presents for follow-up consultation of his obstructive sleep apnea, well-established on PAP therapy.  He continues to be compliant with his current CPAP, machine is over 73 years old at this point.  He has not had sleep testing in years.  We will proceed with a home sleep test for reevaluation of his OSA and to qualify him for new equipment.   Of note, he was diagnosed in 2008 with severe obstructive sleep apnea and has been on CPAP therapy for years.   He is currently on his third machine. He is commended for his treatment adherence, apnea scores are at goal, leak from the mask is on the higher side.   We will plan a follow-up after he gets his new machine and we will make a compliance appointment for him.  We talked briefly about alternative treatment options for sleep apnea but mutually agreed to continue to pursue PAP therapy for him.  He has lost some weight, a little over 10 pounds.  He is advised to continue to work on weight loss.  I answered all his questions today and he was in agreement. I spent 40 minutes in total face-to-face time and in reviewing records during pre-charting, more than 50% of which was spent in counseling and coordination of care, reviewing test results, reviewing medications and treatment regimen and/or in discussing or reviewing the diagnosis of OSA, the prognosis and treatment options. Pertinent laboratory and imaging test results that were available during this visit with the patient were reviewed by me and considered in my medical decision making (see chart for details).

## 2023-12-13 NOTE — Patient Instructions (Signed)
 It was nice to see you again today.  We will proceed with a home sleep test to reevaluate your sleep apnea and to qualify you for a new machine.  Continue to use your current CPAP machine until you get a new machine and continue to stay compliant with treatment.  At the time of testing, please skip using your CPAP, but you may be able to take a nap afterwards when you complete the study in case you did not sleep well and want to catch up on sleep with your CPAP machine. Continue to work on weight loss.  We will plan a follow-up after testing and after you get your new machine.

## 2023-12-18 ENCOUNTER — Telehealth: Payer: Self-pay | Admitting: Neurology

## 2023-12-18 DIAGNOSIS — G4733 Obstructive sleep apnea (adult) (pediatric): Secondary | ICD-10-CM

## 2023-12-18 NOTE — Telephone Encounter (Signed)
 HST [employer] pending

## 2023-12-21 ENCOUNTER — Telehealth: Payer: Self-pay | Admitting: Neurology

## 2023-12-21 NOTE — Telephone Encounter (Signed)
 I made an addendum to his office visit note from 12/13/2023, see also below:  Addendum, 12/21/2023: The patient has had major weight fluctuation over the past few years.  Sleep testing was over 17 years ago and he was diagnosed with severe obstructive sleep apnea but he weighed well over 300 pounds at the time.  He would benefit from reevaluation with a home sleep test given that he previously had critical desaturations and these need to be rechecked especially in the context of major weight loss.  I will submit for authorization for a home sleep test which previously has been denied but it is clinically necessary and indicated.  Current weight is 258 pounds.  Please resubmit for HST authorization.  As per his insurance criteria, weight loss and our weight fluctuation are indications for repeat testing.

## 2023-12-21 NOTE — Telephone Encounter (Signed)
 Noted I have sent the additional information for an appeal with [employer] in the letter it states it can take up to 180 days for the review.

## 2023-12-21 NOTE — Telephone Encounter (Signed)
 See ph note from today.

## 2023-12-21 NOTE — Telephone Encounter (Signed)
 [employer] denied the HST. It appears that you can just order a new CPAP machine.

## 2023-12-22 NOTE — Telephone Encounter (Signed)
 noted

## 2023-12-27 NOTE — Telephone Encounter (Signed)
 Even after submitted additional clinical information [employer] is denying the HST.

## 2023-12-27 NOTE — Telephone Encounter (Signed)
 Even after submitted the additional clinical information. [employer] is denying the [employer].   See phone note from 12/18/23.

## 2023-12-27 NOTE — Telephone Encounter (Signed)
 What would you like to do next Dr Buck?

## 2024-01-01 NOTE — Telephone Encounter (Signed)
 I will order a new CPAP machine for the patient.  No recent sleep study.  Please send order to DME company and arrange for follow-up appointment in about 2 to 3 months after set up date.  May schedule with nurse practitioner for a virtual visit.

## 2024-01-01 NOTE — Addendum Note (Signed)
 Addended by: Jamee Keach on: 01/01/2024 08:25 AM   Modules accepted: Orders

## 2024-01-01 NOTE — Telephone Encounter (Signed)
 I called pt and LMVM for him that insurance denied sleep study.  Dr. Buck did write for a new machine.  Checking on DME co.  Call back when you can.

## 2024-01-08 NOTE — Telephone Encounter (Signed)
 I faxed baseline sleep study to [facility].  Received.

## 2024-01-08 NOTE — Telephone Encounter (Signed)
 Sent message to [facility], new machine order.

## 2024-01-08 NOTE — Telephone Encounter (Signed)
 I called pt.  [facility] will be new DME.  He will call them in 7 days if not heard gave # to [facility].  He will call us  when gets new machine (for time sensitive appt 2-3 months).  He is aware that is insurance compliance and importance of keeping appt.  He appreciated call and verbalized understanding.

## 2024-01-09 NOTE — Telephone Encounter (Signed)
 RE: new machine Received: Today Massenburg, Roma Neysa Nena GORMAN, RN; Heath, Mazurika Good morning  Thank you!     Previous Messages    ----- Message ----- From: Neysa Nena GORMAN, RN Sent: 01/08/2024   4:04 PM EST To: Mazurika Massenburg Subject: FW: new machine                                I have faxed to you 8 pgs   Particia RN ----- Message ----- From: Carol Roma Sent: 01/08/2024   3:44 PM EST To: Sonny Ee; Mazurika Massenburg; Asberry Bunting* Subject: RE: new machine                                Hello Will need a copy of patient baseline sleep study to process cpap order. ----- Message ----- From: Neysa Nena GORMAN, RN Sent: 01/08/2024   1:54 PM EST To: Sonny Ee; [facility]; Asberry Bunting* Subject: new machine                                    New order in [facility] for new machine.  Elsie MYRTIS Found Male, 59 y.o., 1965-02-20 MRN: [id number] Phone: [phone]   Particia SAUNDERS
